# Patient Record
Sex: Female | Born: 1950 | Race: White | Hispanic: No | Marital: Married | Smoking: Never smoker
Health system: Southern US, Community
[De-identification: ages and names within clinical notes are randomized; demographics above are authoritative.]

## PROBLEM LIST (undated history)

## (undated) DIAGNOSIS — Z87898 Personal history of other specified conditions: Secondary | ICD-10-CM

## (undated) DIAGNOSIS — J449 Chronic obstructive pulmonary disease, unspecified: Secondary | ICD-10-CM

## (undated) DIAGNOSIS — K219 Gastro-esophageal reflux disease without esophagitis: Secondary | ICD-10-CM

## (undated) DIAGNOSIS — E782 Mixed hyperlipidemia: Secondary | ICD-10-CM

## (undated) DIAGNOSIS — F329 Major depressive disorder, single episode, unspecified: Secondary | ICD-10-CM

## (undated) DIAGNOSIS — I340 Nonrheumatic mitral (valve) insufficiency: Secondary | ICD-10-CM

## (undated) DIAGNOSIS — J45909 Unspecified asthma, uncomplicated: Secondary | ICD-10-CM

## (undated) DIAGNOSIS — F32A Depression, unspecified: Secondary | ICD-10-CM

## (undated) DIAGNOSIS — L511 Stevens-Johnson syndrome: Secondary | ICD-10-CM

## (undated) DIAGNOSIS — G2581 Restless legs syndrome: Secondary | ICD-10-CM

## (undated) HISTORY — PX: OTHER SURGICAL HISTORY: SHX169

## (undated) HISTORY — DX: Chronic obstructive pulmonary disease, unspecified: J44.9

## (undated) HISTORY — PX: CHOLECYSTECTOMY: SHX55

## (undated) HISTORY — DX: Gastro-esophageal reflux disease without esophagitis: K21.9

## (undated) HISTORY — PX: ROTATOR CUFF REPAIR: SHX139

## (undated) HISTORY — DX: Restless legs syndrome: G25.81

## (undated) HISTORY — DX: Nonrheumatic mitral (valve) insufficiency: I34.0

## (undated) HISTORY — DX: Depression, unspecified: F32.A

## (undated) HISTORY — DX: Stevens-Johnson syndrome: L51.1

## (undated) HISTORY — DX: Major depressive disorder, single episode, unspecified: F32.9

## (undated) HISTORY — PX: ABDOMINAL HYSTERECTOMY: SHX81

## (undated) HISTORY — DX: Mixed hyperlipidemia: E78.2

## (undated) HISTORY — DX: Unspecified asthma, uncomplicated: J45.909

## (undated) HISTORY — DX: Personal history of other specified conditions: Z87.898

---

## 2010-08-17 ENCOUNTER — Other Ambulatory Visit (HOSPITAL_COMMUNITY): Payer: Self-pay | Admitting: Pulmonary Disease

## 2010-08-17 DIAGNOSIS — R05 Cough: Secondary | ICD-10-CM

## 2010-08-17 DIAGNOSIS — R059 Cough, unspecified: Secondary | ICD-10-CM

## 2010-08-17 DIAGNOSIS — R062 Wheezing: Secondary | ICD-10-CM

## 2010-08-18 ENCOUNTER — Ambulatory Visit (HOSPITAL_COMMUNITY)
Admission: RE | Admit: 2010-08-18 | Discharge: 2010-08-18 | Disposition: A | Discharge status: Home / Self Care | Payer: 59 | Source: Ambulatory Visit | Attending: Pulmonary Disease | Admitting: Pulmonary Disease

## 2010-08-18 DIAGNOSIS — R062 Wheezing: Secondary | ICD-10-CM | POA: Insufficient documentation

## 2010-08-18 DIAGNOSIS — R059 Cough, unspecified: Secondary | ICD-10-CM | POA: Insufficient documentation

## 2010-08-18 DIAGNOSIS — R05 Cough: Secondary | ICD-10-CM

## 2010-08-18 DIAGNOSIS — J984 Other disorders of lung: Secondary | ICD-10-CM | POA: Insufficient documentation

## 2010-08-18 MED ORDER — IOHEXOL 300 MG/ML  SOLN
80.0000 mL | Freq: Once | INTRAMUSCULAR | Status: AC | PRN
  Administered 2010-08-18: 80 mL via INTRAVENOUS

## 2010-09-09 ENCOUNTER — Ambulatory Visit: Payer: Self-pay | Admitting: Orthopedic Surgery

## 2011-04-19 ENCOUNTER — Ambulatory Visit (HOSPITAL_COMMUNITY)
Admission: RE | Admit: 2011-04-19 | Discharge: 2011-04-19 | Disposition: A | Discharge status: Home / Self Care | Payer: No Typology Code available for payment source | Source: Ambulatory Visit | Attending: Pulmonary Disease | Admitting: Pulmonary Disease

## 2011-04-19 ENCOUNTER — Other Ambulatory Visit (HOSPITAL_COMMUNITY): Payer: Self-pay | Admitting: Pulmonary Disease

## 2011-04-19 DIAGNOSIS — R059 Cough, unspecified: Secondary | ICD-10-CM

## 2011-04-19 DIAGNOSIS — R509 Fever, unspecified: Secondary | ICD-10-CM | POA: Insufficient documentation

## 2011-04-19 DIAGNOSIS — R05 Cough: Secondary | ICD-10-CM

## 2012-02-08 ENCOUNTER — Other Ambulatory Visit (HOSPITAL_COMMUNITY)
Admission: RE | Admit: 2012-02-08 | Discharge: 2012-02-08 | Disposition: A | Discharge status: Home / Self Care | Payer: BC Managed Care – PPO | Source: Ambulatory Visit | Attending: Dental General Practice | Admitting: Dental General Practice

## 2012-02-08 DIAGNOSIS — K149 Disease of tongue, unspecified: Secondary | ICD-10-CM | POA: Insufficient documentation

## 2012-09-04 ENCOUNTER — Ambulatory Visit (INDEPENDENT_AMBULATORY_CARE_PROVIDER_SITE_OTHER): Payer: BC Managed Care – PPO | Admitting: Cardiology

## 2012-09-04 ENCOUNTER — Other Ambulatory Visit: Payer: Self-pay | Admitting: Cardiology

## 2012-09-04 ENCOUNTER — Ambulatory Visit (HOSPITAL_COMMUNITY)
Admission: RE | Admit: 2012-09-04 | Discharge: 2012-09-04 | Disposition: A | Discharge status: Home / Self Care | Payer: BC Managed Care – PPO | Source: Ambulatory Visit | Attending: Cardiology | Admitting: Cardiology

## 2012-09-04 ENCOUNTER — Encounter: Payer: Self-pay | Admitting: *Deleted

## 2012-09-04 ENCOUNTER — Encounter: Payer: Self-pay | Admitting: Cardiology

## 2012-09-04 VITALS — BP 123/77 | HR 70 | Wt 254.0 lb

## 2012-09-04 DIAGNOSIS — R0609 Other forms of dyspnea: Secondary | ICD-10-CM | POA: Insufficient documentation

## 2012-09-04 DIAGNOSIS — E782 Mixed hyperlipidemia: Secondary | ICD-10-CM

## 2012-09-04 DIAGNOSIS — I340 Nonrheumatic mitral (valve) insufficiency: Secondary | ICD-10-CM | POA: Insufficient documentation

## 2012-09-04 DIAGNOSIS — J4530 Mild persistent asthma, uncomplicated: Secondary | ICD-10-CM | POA: Insufficient documentation

## 2012-09-04 DIAGNOSIS — Z01812 Encounter for preprocedural laboratory examination: Secondary | ICD-10-CM

## 2012-09-04 DIAGNOSIS — I059 Rheumatic mitral valve disease, unspecified: Secondary | ICD-10-CM

## 2012-09-04 DIAGNOSIS — R0789 Other chest pain: Secondary | ICD-10-CM

## 2012-09-04 DIAGNOSIS — J449 Chronic obstructive pulmonary disease, unspecified: Secondary | ICD-10-CM

## 2012-09-04 DIAGNOSIS — Z01818 Encounter for other preprocedural examination: Secondary | ICD-10-CM

## 2012-09-04 DIAGNOSIS — R0602 Shortness of breath: Secondary | ICD-10-CM

## 2012-09-04 DIAGNOSIS — J4489 Other specified chronic obstructive pulmonary disease: Secondary | ICD-10-CM

## 2012-09-04 LAB — CBC
HCT: 35.2 % — ABNORMAL LOW (ref 36.0–46.0)
Hemoglobin: 11.9 g/dL — ABNORMAL LOW (ref 12.0–15.0)
MCV: 91.9 fL (ref 78.0–100.0)
Platelets: 236 10*3/uL (ref 150–400)
RBC: 3.83 MIL/uL — ABNORMAL LOW (ref 3.87–5.11)
WBC: 7.8 10*3/uL (ref 4.0–10.5)

## 2012-09-04 LAB — BASIC METABOLIC PANEL
Chloride: 103 mEq/L (ref 96–112)
Potassium: 4 mEq/L (ref 3.5–5.3)
Sodium: 138 mEq/L (ref 135–145)

## 2012-09-04 LAB — PROTIME-INR: INR: 0.96 (ref <1.50)

## 2012-09-04 NOTE — Assessment & Plan Note (Signed)
At this point definitive etiology is not certain. She has already undergone a fair bit of testing, both cardiac and pulmonary as detailed above. Nothing stands out as a clear culprit. PFTs do show mild to moderate abnormalities in FVC and FEV1, no history of tobacco use reported however. Not clear that she feels any better using inhalers. Noninvasive ischemic workup was normal as of February, but she does report an exertional component to her symptoms as well as a "fullness" in her chest. She does have a sister who underwent bypass surgery in her 18s. ECG shows no acute changes with low voltage. Her echocardiogram demonstrate normal LV systolic function, does not grade diastolic function, but parameters did not clearly indicate increased LV filling pressures. She may have some component of diastolic dysfunction, PASP was 33 mm mercury. We have discussed options for further evaluation, and plan at this time is to pursue a right and left heart catheterization. This will give [place] a more clear assessment of her pulmonary pressures and right heart hemodynamics, also clearly exclude obstructive CAD. If this is not revealing, a followup echocardiogram may be obtained. Not certain how much a CPX would provide to her assessment, but we can always discuss that as well. Heart catheterization is being scheduled with Dr. Gala Romney. Further plans to follow.

## 2012-09-04 NOTE — Progress Notes (Signed)
Clinical Summary Deanna Kirby is a 62 y.o.female referred for cardiology consultation by Dr. Juanetta Gosling. She presents with her husband today. 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 She states that back in January she developed a flulike illness and subsequent to that has been significantly short of breath with activity. She reports NYHA class III symptoms associated with occasional "fullness" in her chest. This has been present for months, she is now on short-term disability. She has had a fairly extensive evaluation so far in both [place] and also at [facility]. Records are noted below.  Available outside records from [place] reviewed. Venous Dopplers from February 2014 demonstrated no evidence of DVT. Myocardial perfusion study from February 2014 was normal, no ischemia described, LVEF 67%. Chest x-ray from December 2013 reported normal cardiac silhouette, clear lung fields.  Available records from [facility] reviewed. Echocardiogram report from March describes LVEF 60-65% without regional wall motion abnormalities, mild mitral regurgitation. E/E' ratio did not clearly indicate increased LV  filling pressures although left atrial dimension was enlarged, diastolic function not otherwise quantified.. Recent lab work in March revealed potassium 3.9, BUN 13, creatinine 0.8, hemoglobin 11.4, platelets 193. PFTs from March revealed pre-FVC 2.1, post-FVC 2.2, pre-FEV1 1.4, post FEV1 1.6.  She is being treated for moderate COPD, no history of tobacco use listed. ECG today shows normal sinus rhythm with borderline low voltage. No definite history of cardiac arrhythmia. Today had discussed the results of testing done so far. Nothing clearly stands out as an explanation for her symptoms which are described as being progressive and fairly marked at baseline.   Allergies  Allergen Reactions  . Formaldehyde     RASH  . Sudafed (Pseudoephedrine Hcl)     RASH  . Tegretol (Carbamazepine)    STEVEN JOHNSON SYNDROME    Current Outpatient Prescriptions  Medication Sig Dispense Refill  . Albuterol Sulfate (PROAIR HFA IN) Inhale into the lungs as directed.      Marland Kitchen ALPRAZolam (XANAX) 0.5 MG tablet Take 0.5 mg by mouth at bedtime as needed for sleep.      Marland Kitchen amoxicillin-clavulanate (AUGMENTIN) 875-125 MG per tablet Take 1 tablet by mouth as needed.      Marland Kitchen aspirin 81 MG tablet Take 81 mg by mouth daily.      Marland Kitchen atenolol (TENORMIN) 25 MG tablet Take 25 mg by mouth daily.      . furosemide (LASIX) 20 MG tablet 1 to 2 tabs po qd      . gabapentin (NEURONTIN) 300 MG capsule Take 300 mg by mouth 2 (two) times daily.      Marland Kitchen ibuprofen (ADVIL,MOTRIN) 800 MG tablet Take 800 mg by mouth 3 (three) times daily.      . pramipexole (MIRAPEX) 0.5 MG tablet 1 1/2 tab po qhs      . predniSONE (DELTASONE) 10 MG tablet Take 10 mg by mouth as needed.      Marland Kitchen QUEtiapine (SEROQUEL) 50 MG tablet Take 50 mg by mouth 2 (two) times daily.      . sertraline (ZOLOFT) 100 MG tablet 1  1/2 tab po qd      . simvastatin (ZOCOR) 40 MG tablet Take 40 mg by mouth every evening.      . topiramate (TOPAMAX) 100 MG tablet Take 100 mg by mouth 2 (two) times daily.       No current facility-administered medications for this visit.    Past Medical History  Diagnosis Date  .  Mixed hyperlipidemia   . Restless leg syndrome   . GERD (gastroesophageal reflux disease)   . Depression   . History of seizures   . Stevens-Johnson syndrome   . COPD (chronic obstructive pulmonary disease)     Reportedly moderate  . Mitral regurgitation     Past Surgical History  Procedure Laterality Date  . Cholecystectomy    . Abdominal hysterectomy    . Rotator cuff repair    . Rectovaginal fistula repair      Family History  Problem Relation Age of Onset  . Stroke Father   . Pneumonia Mother     Sepsis    Social History [name] reports that she has never smoked. She does not have any smokeless tobacco history on file. Ms.  Montemayor reports that she does not drink alcohol.  Review of Systems No specific palpitations. No syncope. No reported bleeding problems. Does not notice any specific wheezing, no marked improvement with inhalers. Just recently started Advair Diskus. No orthopnea or PND. Stable appetite. Otherwise negative.  Physical Examination Filed Vitals:   09/04/12 1336  BP: 123/77  Pulse: 70   Filed Weights   09/04/12 1336  Weight: 254 lb (115.214 kg)   Overweight woman in no acute distress. HEENT: Conjunctiva and lids normal, oropharynx clear. Neck: Supple, normal JVP or carotid bruits, no thyromegaly. Lungs: Clear to auscultation, nonlabored breathing at rest. Cardiac: Regular rate and rhythm, no S3 or significant systolic murmur, no pericardial rub. Abdomen: Soft, nontender, bowel sounds present. Extremities: 2+ edema, distal pulses 2+. Skin: Warm and dry. Musculoskeletal: No kyphosis. Neuropsychiatric: Alert and oriented x3, affect grossly appropriate.   Problem List and Plan   Shortness of breath At this point definitive etiology is not certain. She has already undergone a fair bit of testing, both cardiac and pulmonary as detailed above. Nothing stands out as a clear culprit. PFTs do show mild to moderate abnormalities in FVC and FEV1, no history of tobacco use reported however. Not clear that she feels any better using inhalers. Noninvasive ischemic workup was normal as of February, but she does report an exertional component to her symptoms as well as a "fullness" in her chest. She does have a sister who underwent bypass surgery in her 34s. ECG shows no acute changes with low voltage. Her echocardiogram demonstrate normal LV systolic function, does not grade diastolic function, but parameters did not clearly indicate increased LV filling pressures. She may have some component of diastolic dysfunction, PASP was 33 mm mercury. We have discussed options for further evaluation, and plan at this  time is to pursue a right and left heart catheterization. This will give [place] a more clear assessment of her pulmonary pressures and right heart hemodynamics, also clearly exclude obstructive CAD. If this is not revealing, a followup echocardiogram may be obtained. Not certain how much a CPX would provide to her assessment, but we can always discuss that as well. Heart catheterization is being scheduled with Dr. Gala Romney. Further plans to follow.  Mitral regurgitation Described as mild by recent echocardiogram in March. PASP was not significantly elevated.  COPD (chronic obstructive pulmonary disease) Now followed by Dr. Juanetta Gosling. Not certain how much this contributes to her symptoms. No reported tobacco use.  Mixed hyperlipidemia On statin therapy.     Jonelle Sidle, M.D., F.A.C.C.

## 2012-09-04 NOTE — Assessment & Plan Note (Signed)
On statin therapy 

## 2012-09-04 NOTE — Patient Instructions (Addendum)
Your physician recommends that you schedule a follow-up appointment in: post cath  Your physician has requested that you have a cardiac catheterization. Cardiac catheterization is used to diagnose and/or treat various heart conditions. Doctors may recommend this procedure for a number of different reasons. The most common reason is to evaluate chest pain. Chest pain can be a symptom of coronary artery disease (CAD), and cardiac catheterization can show whether plaque is narrowing or blocking your heart's arteries. This procedure is also used to evaluate the valves, as well as measure the blood flow and oxygen levels in different parts of your heart. For further information please visit [email or web address] Please follow instruction sheet, as given.  Your physician recommends that you return for lab work in: today (cbc, bmet, pt/pt/inr) slips given

## 2012-09-04 NOTE — Assessment & Plan Note (Signed)
Now followed by Dr. Juanetta Gosling. Not certain how much this contributes to her symptoms. No reported tobacco use.

## 2012-09-04 NOTE — Assessment & Plan Note (Signed)
Described as mild by recent echocardiogram in March. PASP was not significantly elevated.

## 2012-09-05 ENCOUNTER — Encounter: Payer: Self-pay | Admitting: Cardiology

## 2012-09-12 ENCOUNTER — Encounter (HOSPITAL_BASED_OUTPATIENT_CLINIC_OR_DEPARTMENT_OTHER)
Admission: RE | Disposition: A | Discharge status: Home / Self Care | Payer: Self-pay | Source: Ambulatory Visit | Attending: Internal Medicine

## 2012-09-12 ENCOUNTER — Inpatient Hospital Stay (HOSPITAL_BASED_OUTPATIENT_CLINIC_OR_DEPARTMENT_OTHER)
Admission: RE | Admit: 2012-09-12 | Discharge: 2012-09-12 | Disposition: A | Discharge status: Home / Self Care | Payer: BC Managed Care – PPO | Source: Ambulatory Visit | Attending: Internal Medicine | Admitting: Internal Medicine

## 2012-09-12 DIAGNOSIS — J449 Chronic obstructive pulmonary disease, unspecified: Secondary | ICD-10-CM | POA: Insufficient documentation

## 2012-09-12 DIAGNOSIS — R404 Transient alteration of awareness: Secondary | ICD-10-CM | POA: Insufficient documentation

## 2012-09-12 DIAGNOSIS — R079 Chest pain, unspecified: Secondary | ICD-10-CM

## 2012-09-12 DIAGNOSIS — J4489 Other specified chronic obstructive pulmonary disease: Secondary | ICD-10-CM | POA: Insufficient documentation

## 2012-09-12 DIAGNOSIS — R0602 Shortness of breath: Secondary | ICD-10-CM | POA: Insufficient documentation

## 2012-09-12 DIAGNOSIS — E782 Mixed hyperlipidemia: Secondary | ICD-10-CM | POA: Insufficient documentation

## 2012-09-12 DIAGNOSIS — R5381 Other malaise: Secondary | ICD-10-CM | POA: Insufficient documentation

## 2012-09-12 DIAGNOSIS — R0989 Other specified symptoms and signs involving the circulatory and respiratory systems: Secondary | ICD-10-CM | POA: Insufficient documentation

## 2012-09-12 DIAGNOSIS — R0789 Other chest pain: Secondary | ICD-10-CM | POA: Insufficient documentation

## 2012-09-12 DIAGNOSIS — R0609 Other forms of dyspnea: Secondary | ICD-10-CM | POA: Insufficient documentation

## 2012-09-12 DIAGNOSIS — I059 Rheumatic mitral valve disease, unspecified: Secondary | ICD-10-CM | POA: Insufficient documentation

## 2012-09-12 LAB — POCT I-STAT 3, VENOUS BLOOD GAS (G3P V)
Acid-base deficit: 3 mmol/L — ABNORMAL HIGH (ref 0.0–2.0)
Bicarbonate: 22 mEq/L (ref 20.0–24.0)
Bicarbonate: 22.6 mEq/L (ref 20.0–24.0)
Bicarbonate: 22.9 mEq/L (ref 20.0–24.0)
O2 Saturation: 68 %
TCO2: 23 mmol/L (ref 0–100)
TCO2: 24 mmol/L (ref 0–100)
pCO2, Ven: 46.6 mmHg (ref 45.0–50.0)
pH, Ven: 7.29 (ref 7.250–7.300)
pH, Ven: 7.295 (ref 7.250–7.300)
pO2, Ven: 36 mmHg (ref 30.0–45.0)
pO2, Ven: 39 mmHg (ref 30.0–45.0)
pO2, Ven: 44 mmHg (ref 30.0–45.0)

## 2012-09-12 LAB — POCT I-STAT 3, ART BLOOD GAS (G3+)
O2 Saturation: 99 %
TCO2: 22 mmol/L (ref 0–100)

## 2012-09-12 SURGERY — JV LEFT AND RIGHT HEART CATHETERIZATION WITH CORONARY ANGIOGRAM
Anesthesia: Moderate Sedation

## 2012-09-12 MED ORDER — ACETAMINOPHEN 325 MG PO TABS: 650.0000 mg | ORAL_TABLET | ORAL | Status: DC | PRN

## 2012-09-12 MED ORDER — SODIUM CHLORIDE 0.9 % IV SOLN: INTRAVENOUS | Status: DC

## 2012-09-12 MED ORDER — ASPIRIN 81 MG PO CHEW
324.0000 mg | CHEWABLE_TABLET | ORAL | Status: AC
  Administered 2012-09-12: 243 mg via ORAL

## 2012-09-12 MED ORDER — ONDANSETRON HCL 4 MG/2ML IJ SOLN: 4.0000 mg | Freq: Four times a day (QID) | INTRAMUSCULAR | Status: DC | PRN

## 2012-09-12 MED ORDER — DIAZEPAM 5 MG PO TABS
5.0000 mg | ORAL_TABLET | ORAL | Status: AC
  Administered 2012-09-12: 5 mg via ORAL

## 2012-09-12 MED ORDER — SODIUM CHLORIDE 0.9 % IV SOLN: INTRAVENOUS | Status: AC

## 2012-09-12 NOTE — OR Nursing (Signed)
Meal served 

## 2012-09-12 NOTE — H&P (View-Only) (Signed)
 Clinical Summary Deanna Kirby is a 62 y.o.female referred for cardiology consultation by Deanna Kirby. She presents with her husband today. 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 She states that back in January she developed a flulike illness and subsequent to that has been significantly short of breath with activity. She reports NYHA class III symptoms associated with occasional "fullness" in her chest. This has been present for months, she is now on short-term disability. She has had a fairly extensive evaluation so far in both [place] and also at [facility]. Records are noted below.  Available outside records from [place] reviewed. Venous Dopplers from February 2014 demonstrated no evidence of DVT. Myocardial perfusion study from February 2014 was normal, no ischemia described, LVEF 67%. Chest x-ray from December 2013 reported normal cardiac silhouette, clear lung fields.  Available records from [facility] reviewed. Echocardiogram report from March describes LVEF 60-65% without regional wall motion abnormalities, mild mitral regurgitation. E/E' ratio did not clearly indicate increased LV  filling pressures although left atrial dimension was enlarged, diastolic function not otherwise quantified.. Recent lab work in March revealed potassium 3.9, BUN 13, creatinine 0.8, hemoglobin 11.4, platelets 193. PFTs from March revealed pre-FVC 2.1, post-FVC 2.2, pre-FEV1 1.4, post FEV1 1.6.  She is being treated for moderate COPD, no history of tobacco use listed. ECG today shows normal sinus rhythm with borderline low voltage. No definite history of cardiac arrhythmia. Today had discussed the results of testing done so far. Nothing clearly stands out as an explanation for her symptoms which are described as being progressive and fairly marked at baseline.   Allergies  Allergen Reactions  . Formaldehyde     RASH  . Sudafed (Pseudoephedrine Hcl)     RASH  . Tegretol (Carbamazepine)    STEVEN JOHNSON SYNDROME    Current Outpatient Prescriptions  Medication Sig Dispense Refill  . Albuterol Sulfate (PROAIR HFA IN) Inhale into the lungs as directed.      . ALPRAZolam (XANAX) 0.5 MG tablet Take 0.5 mg by mouth at bedtime as needed for sleep.      . amoxicillin-clavulanate (AUGMENTIN) 875-125 MG per tablet Take 1 tablet by mouth as needed.      . aspirin 81 MG tablet Take 81 mg by mouth daily.      . atenolol (TENORMIN) 25 MG tablet Take 25 mg by mouth daily.      . furosemide (LASIX) 20 MG tablet 1 to 2 tabs po qd      . gabapentin (NEURONTIN) 300 MG capsule Take 300 mg by mouth 2 (two) times daily.      . ibuprofen (ADVIL,MOTRIN) 800 MG tablet Take 800 mg by mouth 3 (three) times daily.      . pramipexole (MIRAPEX) 0.5 MG tablet 1 1/2 tab po qhs      . predniSONE (DELTASONE) 10 MG tablet Take 10 mg by mouth as needed.      . QUEtiapine (SEROQUEL) 50 MG tablet Take 50 mg by mouth 2 (two) times daily.      . sertraline (ZOLOFT) 100 MG tablet 1  1/2 tab po qd      . simvastatin (ZOCOR) 40 MG tablet Take 40 mg by mouth every evening.      . topiramate (TOPAMAX) 100 MG tablet Take 100 mg by mouth 2 (two) times daily.       No current facility-administered medications for this visit.    Past Medical History  Diagnosis Date  .   Mixed hyperlipidemia   . Restless leg syndrome   . GERD (gastroesophageal reflux disease)   . Depression   . History of seizures   . Stevens-Johnson syndrome   . COPD (chronic obstructive pulmonary disease)     Reportedly moderate  . Mitral regurgitation     Past Surgical History  Procedure Laterality Date  . Cholecystectomy    . Abdominal hysterectomy    . Rotator cuff repair    . Rectovaginal fistula repair      Family History  Problem Relation Age of Onset  . Stroke Father   . Pneumonia Mother     Sepsis    Social History Deanna Kirby reports that she has never smoked. She does not have any smokeless tobacco history on file. [name] reports that she does not drink alcohol.  Review of Systems No specific palpitations. No syncope. No reported bleeding problems. Does not notice any specific wheezing, no marked improvement with inhalers. Just recently started Advair Diskus. No orthopnea or PND. Stable appetite. Otherwise negative.  Physical Examination Filed Vitals:   09/04/12 1336  BP: 123/77  Pulse: 70   Filed Weights   09/04/12 1336  Weight: 254 lb (115.214 kg)   Overweight woman in no acute distress. HEENT: Conjunctiva and lids normal, oropharynx clear. Neck: Supple, normal JVP or carotid bruits, no thyromegaly. Lungs: Clear to auscultation, nonlabored breathing at rest. Cardiac: Regular rate and rhythm, no S3 or significant systolic murmur, no pericardial rub. Abdomen: Soft, nontender, bowel sounds present. Extremities: 2+ edema, distal pulses 2+. Skin: Warm and dry. Musculoskeletal: No kyphosis. Neuropsychiatric: Alert and oriented x3, affect grossly appropriate.   Problem List and Plan   Shortness of breath At this point definitive etiology is not certain. She has already undergone a fair bit of testing, both cardiac and pulmonary as detailed above. Nothing stands out as a clear culprit. PFTs do show mild to moderate abnormalities in FVC and FEV1, no history of tobacco use reported however. Not clear that she feels any better using inhalers. Noninvasive ischemic workup was normal as of February, but she does report an exertional component to her symptoms as well as a "fullness" in her chest. She does have a sister who underwent bypass surgery in her 50s. ECG shows no acute changes with low voltage. Her echocardiogram demonstrate normal LV systolic function, does not grade diastolic function, but parameters did not clearly indicate increased LV filling pressures. She may have some component of diastolic dysfunction, PASP was 33 mm mercury. We have discussed options for further evaluation, and plan at this  time is to pursue a right and left heart catheterization. This will give us a more clear assessment of her pulmonary pressures and right heart hemodynamics, also clearly exclude obstructive CAD. If this is not revealing, a followup echocardiogram may be obtained. Not certain how much a CPX would provide to her assessment, but we can always discuss that as well. Heart catheterization is being scheduled with Dr. Bensimhon. Further plans to follow.  Mitral regurgitation Described as mild by recent echocardiogram in March. PASP was not significantly elevated.  COPD (chronic obstructive pulmonary disease) Now followed by Deanna Kirby. Not certain how much this contributes to her symptoms. No reported tobacco use.  Mixed hyperlipidemia On statin therapy.     Samuel G. McDowell, M.D., F.A.C.C.   

## 2012-09-12 NOTE — OR Nursing (Signed)
Tegaderm dressing applied, site level 0, bedrest begins at 1025 

## 2012-09-12 NOTE — OR Nursing (Signed)
Dr Bensimhon at bedside to discuss results and treatment plan with pt and family 

## 2012-09-12 NOTE — Interval H&P Note (Signed)
History and Physical Interval Note:  09/12/2012 8:59 AM  Deanna Kirby  has presented today for surgery, with the diagnosis of dyspnea and chest pressure  The various methods of treatment have been discussed with the patient and family. After consideration of risks, benefits and other options for treatment, the patient has consented to  Procedure(s): JV LEFT AND RIGHT HEART CATHETERIZATION WITH CORONARY ANGIOGRAM (N/A) as a surgical intervention .  The patient's history has been reviewed, patient examined, no change in status, stable for surgery.  I have reviewed the patient's chart and labs.  Questions were answered to the patient's satisfaction.     Revecca Nachtigal

## 2012-09-12 NOTE — OR Nursing (Signed)
Discharge instructions reviewed and signed, pt stated understanding, ambulated in hall without difficulty, site level 0, transported to husband's car via wheelchair 

## 2012-09-12 NOTE — CV Procedure (Signed)
Cardiac Cath Procedure Note  Indication: Dyspnea, chest pressure  Procedures performed:  1) Right heart cathererization 2) Selective coronary angiography 3) Left heart catheterization 4) Left ventriculogram  Description of procedure:     The risks and indication of the procedure were explained. Consent was signed and placed on the chart. An appropriate timeout was taken prior to the procedure. The right groin was prepped and draped in the routine sterile fashion and anesthetized with 1% local lidocaine.   A 5 FR arterial sheath was placed in the right femoral artery using a modified Seldinger technique. Standard catheters including a JL4, JR4 and angled pigtail were used. All catheter exchanges were made over a wire. A 7 FR venous sheath was placed in the right femoral vein using a modified Seldinger technique. A standard Swan-Ganz catheter was used for the procedure.   Total contrast: 60cc  Complications:  None apparent  Findings:  RA = 9 RV = 35/0/11 PA = 35/18 (26) PCW = 13 Fick cardiac output/index = 6.6/3.0 PVR =  2.0 Woods FA sat = 99% PA sat = 72%, 74% High SVC = 62% RA sat = 68%  Ao Pressure: 118/64 (87) LV Pressure: 118/11/19 There was no signficant gradient across the aortic valve on pullback.  Left main: Normal  LAD: 2 small diagonals. Normal  LCX: Large ramus. 2 small PLs. Normal  RCA: Dominant vessel. Normal   LV-gram done in the RAO projection: Ejection fraction = 55% no wall motion abnormalities.  Assessment: 1. Normal coronary arteries 2. Normal LV function 3. Normal left sided filling pressures 4. Very mild PAH 5. Small step up between SVC and PA saturations - suspect it is normal but cannot exclude minimal L to right shunt  Plan/Discussion:  Cath looks very good. Based on cath results and symptoms (fatigue, dyspnea and daytime somnolence) suspect she likely has OSA and possibly nocturnal hypoxemia. Have recommended f/u with Dr. Juanetta Gosling for  sleep study (versus overnight oximetry) and also enrolling in exercise program. If no improvement in symptoms in 3 months can consider CPX testing.   Deanna Meres, MD 10:08 AM

## 2012-09-18 ENCOUNTER — Ambulatory Visit (INDEPENDENT_AMBULATORY_CARE_PROVIDER_SITE_OTHER): Payer: BC Managed Care – PPO | Admitting: Adult Health

## 2012-09-18 ENCOUNTER — Encounter: Payer: Self-pay | Admitting: Adult Health

## 2012-09-18 VITALS — BP 116/71 | HR 75 | Ht 62.5 in | Wt 256.1 lb

## 2012-09-18 DIAGNOSIS — E782 Mixed hyperlipidemia: Secondary | ICD-10-CM

## 2012-09-18 DIAGNOSIS — I340 Nonrheumatic mitral (valve) insufficiency: Secondary | ICD-10-CM

## 2012-09-18 DIAGNOSIS — J449 Chronic obstructive pulmonary disease, unspecified: Secondary | ICD-10-CM

## 2012-09-18 DIAGNOSIS — R0602 Shortness of breath: Secondary | ICD-10-CM

## 2012-09-18 DIAGNOSIS — I059 Rheumatic mitral valve disease, unspecified: Secondary | ICD-10-CM

## 2012-09-18 NOTE — Progress Notes (Signed)
HPI Ms. Perot is a 62 y/o patient of Dr. Diona Browner we are following for ongoing assessment and management of chest pressure, mitral regurgitation, with a history of COPD and chronic New York Heart Association class III.  She was last seen by Dr. Diona Browner in May of 2014 dyspnea. Last office visit the patient was scheduled for a right and left heart catheterization. Right heart catheterization was completed on 09/12/2012 by Dr. Gala Romney. This revealed normal coronary anatomy normal LV function normal left-sided filling pressures. Very mild PAH. As recommended by Dr. Gala Romney on that she likely has OSA and possible nocturnal hypoxemia. She is recommended to have a sleep study.   Have dyspnea on exertion. She states her husband has noticed that she has a lot of abdominal breathing but does not appear to have any air movement. She has gained approximately 20 pounds over the last year. She continues very fatigued. Allergies  Allergen Reactions  . Formaldehyde     RASH  . Sudafed (Pseudoephedrine Hcl)     RASH  . Tegretol (Carbamazepine)     STEVEN JOHNSON SYNDROME    Current Outpatient Prescriptions  Medication Sig Dispense Refill  . Albuterol Sulfate (PROAIR HFA IN) Inhale into the lungs as directed.      Marland Kitchen ALPRAZolam (XANAX) 0.5 MG tablet Take 0.5 mg by mouth at bedtime as needed for sleep.      Marland Kitchen amoxicillin-clavulanate (AUGMENTIN) 875-125 MG per tablet Take 1 tablet by mouth as needed.      Marland Kitchen aspirin 81 MG tablet Take 81 mg by mouth daily.      Marland Kitchen atenolol (TENORMIN) 25 MG tablet Take 25 mg by mouth daily.      . Fluticasone-Salmeterol (ADVAIR) 500-50 MCG/DOSE AEPB Inhale 1 puff into the lungs 2 (two) times daily.      . furosemide (LASIX) 20 MG tablet 1 to 2 tabs po qd      . gabapentin (NEURONTIN) 300 MG capsule Take 300 mg by mouth 2 (two) times daily.      Marland Kitchen ibuprofen (ADVIL,MOTRIN) 800 MG tablet Take 800 mg by mouth 3 (three) times daily.      . pramipexole (MIRAPEX) 0.5 MG tablet 1 1/2  tab po qhs      . predniSONE (DELTASONE) 10 MG tablet Take 10 mg by mouth as needed.      Marland Kitchen QUEtiapine (SEROQUEL) 50 MG tablet Take 50 mg by mouth 2 (two) times daily.      . sertraline (ZOLOFT) 100 MG tablet 1  1/2 tab po qd      . simvastatin (ZOCOR) 40 MG tablet Take 40 mg by mouth every evening.      . topiramate (TOPAMAX) 100 MG tablet Take 100 mg by mouth 2 (two) times daily.       No current facility-administered medications for this visit.    Past Medical History  Diagnosis Date  . Mixed hyperlipidemia   . Restless leg syndrome   . GERD (gastroesophageal reflux disease)   . Depression   . History of seizures   . Stevens-Johnson syndrome   . COPD (chronic obstructive pulmonary disease)     Reportedly moderate  . Mitral regurgitation     Past Surgical History  Procedure Laterality Date  . Cholecystectomy    . Abdominal hysterectomy    . Rotator cuff repair    . Rectovaginal fistula repair      WUJ:[id number] of systems complete and found to be negative unless listed above  PHYSICAL  EXAM BP 116/71  Pulse 75  Ht 4' 4.5" (1.334 m)  Wt 256 lb 1.9 oz (116.175 kg)  BMI 65.28 kg/m2  General: Well developed, well nourished, obese in no acute distress Head: Eyes PERRLA, No xanthomas.   Normal cephalic and atramatic  Lungs: Clear bilaterally to auscultation and percussion. Heart: HRRR S1 S2, without MRG.  Pulses are 2+ & equal.            No carotid bruit. No JVD.  No abdominal bruits. No femoral bruits. Abdomen: Bowel sounds are positive, abdomen soft and non-tender without masses or                  Hernia's noted. Msk:  Back normal, normal gait. Normal strength and tone for age. Extremities: No clubbing, cyanosis or edema.  DP +1 Neuro: Alert and oriented X 3. Psych:  Good affect, responds appropriately    ASSESSMENT AND PLAN

## 2012-09-18 NOTE — Patient Instructions (Addendum)
Your physician recommends that you schedule a follow-up appointment in: 6 months  

## 2012-09-18 NOTE — Assessment & Plan Note (Signed)
Cardiac catheterization was reassuring with normal coronary anatomy and LV function. She is referred back to Dr. Abbe Amsterdam for further evaluation of obstructive sleep apnea and need for CPAP or other oxygen support during the nighttime. She has gained approximately 20 pounds we have discussed this component also playing into her dyspnea on exertion. If she does require oxygen at night hopefully this will increase her stamina and energy during the daytime so that she can increase her activity and begin doing his weight as well.

## 2012-09-18 NOTE — Assessment & Plan Note (Signed)
Continue current management by Dr. Juanetta Gosling

## 2012-09-18 NOTE — Progress Notes (Deleted)
Name: Deanna Kirby    DOB: 06-Dec-1950  Age: 62 y.o.  MR#: [id number]       PCP:  Eldridge Abrahams, MD      Insurance: Payor: BLUE CROSS BLUE [employer]IELD / Plan: BCBS PPO OUT OF STATE / Product Type: *No Product type* /   CC:    Chief Complaint  Patient presents with  . Shortness of Breath    VS Filed Vitals:   09/18/12 1424  BP: 116/71  Pulse: 75  Height: 4' 4.5" (1.334 m)  Weight: 256 lb 1.9 oz (116.175 kg)    Weights Current Weight  09/18/12 256 lb 1.9 oz (116.175 kg)  09/12/12 250 lb (113.399 kg)  09/12/12 250 lb (113.399 kg)    Blood Pressure  BP Readings from Last 3 Encounters:  09/18/12 116/71  09/12/12 152/98  09/12/12 152/98     Admit date:  (Not on file) Last encounter with RMR:  Visit date not found   Allergy Formaldehyde; Sudafed; and Tegretol  Current Outpatient Prescriptions  Medication Sig Dispense Refill  . Albuterol Sulfate (PROAIR HFA IN) Inhale into the lungs as directed.      Marland Kitchen ALPRAZolam (XANAX) 0.5 MG tablet Take 0.5 mg by mouth at bedtime as needed for sleep.      Marland Kitchen amoxicillin-clavulanate (AUGMENTIN) 875-125 MG per tablet Take 1 tablet by mouth as needed.      Marland Kitchen aspirin 81 MG tablet Take 81 mg by mouth daily.      Marland Kitchen atenolol (TENORMIN) 25 MG tablet Take 25 mg by mouth daily.      . Fluticasone-Salmeterol (ADVAIR) 500-50 MCG/DOSE AEPB Inhale 1 puff into the lungs 2 (two) times daily.      . furosemide (LASIX) 20 MG tablet 1 to 2 tabs po qd      . gabapentin (NEURONTIN) 300 MG capsule Take 300 mg by mouth 2 (two) times daily.      Marland Kitchen ibuprofen (ADVIL,MOTRIN) 800 MG tablet Take 800 mg by mouth 3 (three) times daily.      . pramipexole (MIRAPEX) 0.5 MG tablet 1 1/2 tab po qhs      . predniSONE (DELTASONE) 10 MG tablet Take 10 mg by mouth as needed.      Marland Kitchen QUEtiapine (SEROQUEL) 50 MG tablet Take 50 mg by mouth 2 (two) times daily.      . sertraline (ZOLOFT) 100 MG tablet 1  1/2 tab po qd      . simvastatin (ZOCOR) 40 MG tablet Take 40 mg by mouth  every evening.      . topiramate (TOPAMAX) 100 MG tablet Take 100 mg by mouth 2 (two) times daily.       No current facility-administered medications for this visit.    Discontinued Meds:   There are no discontinued medications.  Patient Active Problem List   Diagnosis Date Noted  . Shortness of breath 09/04/2012  . Mitral regurgitation 09/04/2012  . COPD (chronic obstructive pulmonary disease) 09/04/2012  . Mixed hyperlipidemia 09/04/2012    LABS    Component Value Date/Time   NA 138 09/04/2012 1424   K 4.0 09/04/2012 1424   CL 103 09/04/2012 1424   CO2 26 09/04/2012 1424   GLUCOSE 93 09/04/2012 1424   BUN 12 09/04/2012 1424   CREATININE 0.81 09/04/2012 1424   CALCIUM 9.6 09/04/2012 1424   CMP     Component Value Date/Time   NA 138 09/04/2012 1424   K 4.0 09/04/2012 1424   CL 103 09/04/2012  1424   CO2 26 09/04/2012 1424   GLUCOSE 93 09/04/2012 1424   BUN 12 09/04/2012 1424   CREATININE 0.81 09/04/2012 1424   CALCIUM 9.6 09/04/2012 1424       Component Value Date/Time   WBC 7.8 09/04/2012 1424   HGB 11.9* 09/04/2012 1424   HCT 35.2* 09/04/2012 1424   MCV 91.9 09/04/2012 1424    Lipid Panel  No results found for this basename: chol, trig, hdl, cholhdl, vldl, ldlcalc    ABG    Component Value Date/Time   PHART 7.356 09/12/2012 0944   PCO2ART 37.3 09/12/2012 0944   PO2ART 121.0* 09/12/2012 0944   HCO3 22.6 09/12/2012 1007   TCO2 24 09/12/2012 1007   ACIDBASEDEF 4.0* 09/12/2012 1007   O2SAT 68.0 09/12/2012 1007     No results found for this basename: TSH   BNP (last 3 results) No results found for this basename: PROBNP,  in the last 8760 hours Cardiac Panel (last 3 results) No results found for this basename: CKTOTAL, CKMB, TROPONINI, RELINDX,  in the last 72 hours  Iron/TIBC/Ferritin No results found for this basename: iron, tibc, ferritin     EKG Orders placed in visit on 09/18/12  . EKG 12-LEAD     Prior Assessment and Plan Problem List as of 09/18/2012   Shortness of breath   Last  Assessment & Plan   09/04/2012 Office Visit Written 09/04/2012  3:58 PM by Jonelle Sidle, MD     At this point definitive etiology is not certain. She has already undergone a fair bit of testing, both cardiac and pulmonary as detailed above. Nothing stands out as a clear culprit. PFTs do show mild to moderate abnormalities in FVC and FEV1, no history of tobacco use reported however. Not clear that she feels any better using inhalers. Noninvasive ischemic workup was normal as of February, but she does report an exertional component to her symptoms as well as a "fullness" in her chest. She does have a sister who underwent bypass surgery in her 35s. ECG shows no acute changes with low voltage. Her echocardiogram demonstrate normal LV systolic function, does not grade diastolic function, but parameters did not clearly indicate increased LV filling pressures. She may have some component of diastolic dysfunction, PASP was 33 mm mercury. We have discussed options for further evaluation, and plan at this time is to pursue a right and left heart catheterization. This will give [place] a more clear assessment of her pulmonary pressures and right heart hemodynamics, also clearly exclude obstructive CAD. If this is not revealing, a followup echocardiogram may be obtained. Not certain how much a CPX would provide to her assessment, but we can always discuss that as well. Heart catheterization is being scheduled with Dr. Gala Romney. Further plans to follow.    Mitral regurgitation   Last Assessment & Plan   09/04/2012 Office Visit Written 09/04/2012  3:58 PM by Jonelle Sidle, MD     Described as mild by recent echocardiogram in March. PASP was not significantly elevated.    COPD (chronic obstructive pulmonary disease)   Last Assessment & Plan   09/04/2012 Office Visit Written 09/04/2012  3:59 PM by Jonelle Sidle, MD     Now followed by Dr. Juanetta Gosling. Not certain how much this contributes to her symptoms. No reported tobacco  use.    Mixed hyperlipidemia   Last Assessment & Plan   09/04/2012 Office Visit Written 09/04/2012  4:00 PM by Jonelle Sidle, MD  On statin therapy.         Imaging: Dg Chest 2 View  09/04/2012   *RADIOLOGY REPORT*  Clinical Data: Preoperative chest x-ray  CHEST - 2 VIEW  Comparison: Prior chest x-ray 04/19/2011  Findings: Stable lingular scarring versus atelectasis. Slightly progressed cardiomegaly.  Bronchitic changes are similar to prior. The lungs are well aerated and negative for edema, focal airspace consolidation, pleural effusion or pneumothorax.  No suspicious pulmonary nodule.  No acute osseous abnormality. Chole clips in the right upper quadrant.  IMPRESSION:  1.  Interval development mild cardiomegaly 2.  Otherwise, no acute cardiopulmonary disease.  Stable bronchitic changes.   Original Report Authenticated By: Malachy Moan, M.D.

## 2012-09-18 NOTE — Assessment & Plan Note (Signed)
Ongoing assessment of this with labs. She is normally followed by Dr. Juanetta Gosling is her PCP for yearly evaluation. She will continue on simvastatin at at bedtime

## 2012-09-28 ENCOUNTER — Other Ambulatory Visit: Payer: Self-pay | Admitting: Pulmonary Disease

## 2012-09-28 DIAGNOSIS — G4730 Sleep apnea, unspecified: Secondary | ICD-10-CM

## 2012-10-03 ENCOUNTER — Ambulatory Visit: Payer: BC Managed Care – PPO | Attending: Pulmonary Disease | Admitting: Sleep Medicine

## 2012-10-03 DIAGNOSIS — G4733 Obstructive sleep apnea (adult) (pediatric): Secondary | ICD-10-CM | POA: Insufficient documentation

## 2012-10-03 DIAGNOSIS — Z6841 Body Mass Index (BMI) 40.0 and over, adult: Secondary | ICD-10-CM | POA: Insufficient documentation

## 2012-10-03 DIAGNOSIS — G4710 Hypersomnia, unspecified: Secondary | ICD-10-CM | POA: Insufficient documentation

## 2012-10-03 DIAGNOSIS — G4730 Sleep apnea, unspecified: Secondary | ICD-10-CM

## 2012-10-04 NOTE — Procedures (Signed)
[facility] Jordy Hewins A. Gerilyn Pilgrim, MD     [email or web address]         NAME:  Deanna Kirby, Deanna Kirby                ACCOUNT NO.:  [id number]  MEDICAL RECORD NO.:  [id number]          PATIENT TYPE:  OUT  LOCATION:  SLEEP LAB                     FACILITY:  [facility]  PHYSICIAN:  Chrisoula Zegarra A. Gerilyn Pilgrim, M.D. DATE OF BIRTH:  1950/05/23  DATE OF STUDY:  10/03/2012                           NOCTURNAL POLYSOMNOGRAM  REFERRING PHYSICIAN:  DANIEL L POMPOSINI  REFERRING PHYSICIAN:  Edward L. Juanetta Gosling, MD  INDICATIONS:  A 62 year old man who presents with fatigue, hypersomnia, and obesity.  MEDICATIONS:  Lasix, Advair Diskus, gabapentin, topiramate, alprazolam, simvastatin, Seroquel, albuterol, Tylenol, ibuprofen, aspirin, Zoloft.  EPWORTH SLEEPINESS SCALE: 1. BMI 46.  ARCHITECTURAL SUMMARY:  The total recording time is 388 minutes.  Sleep efficiency 83%.  Sleep latency 12 minutes.  REM latency 123 minutes. Stage N1 1%, N2 55%, N3 26%, and REM sleep 18%.  RESPIRATORY SUMMARY:  Baseline oxygen saturation is 96, lowest saturation 86 during REM sleep.  Diagnostic AHI is 5 and RDI 5.  The events occurred almost exclusively during REM sleep with a REM AHI being 23.  LIMB MOVEMENT SUMMARY:  PLM index is 0.  ELECTROCARDIOGRAM SUMMARY:  Average heart rate is 69 with no significant dysrhythmias observed.  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  Thanks for this referral.     Tida Saner A. Gerilyn Pilgrim, M.D.    KAD/MEDQ  D:  10/04/2012 09:01:28  T:  10/04/2012 09:19:04  Job:  [id number]

## 2012-10-08 ENCOUNTER — Encounter: Payer: Self-pay | Admitting: Cardiology

## 2012-10-11 ENCOUNTER — Encounter: Payer: Self-pay | Admitting: Pulmonary Disease

## 2012-10-23 ENCOUNTER — Other Ambulatory Visit (HOSPITAL_COMMUNITY): Payer: Self-pay | Admitting: Pulmonary Disease

## 2012-10-23 DIAGNOSIS — R0602 Shortness of breath: Secondary | ICD-10-CM

## 2012-11-01 ENCOUNTER — Ambulatory Visit (HOSPITAL_COMMUNITY): Payer: BC Managed Care – PPO | Attending: Pulmonary Disease

## 2012-11-01 DIAGNOSIS — R0602 Shortness of breath: Secondary | ICD-10-CM

## 2012-12-02 ENCOUNTER — Encounter: Payer: Self-pay | Admitting: Adult Health

## 2013-03-05 ENCOUNTER — Other Ambulatory Visit (HOSPITAL_COMMUNITY): Payer: Self-pay | Admitting: Pulmonary Disease

## 2013-03-05 DIAGNOSIS — R531 Weakness: Secondary | ICD-10-CM

## 2013-03-05 DIAGNOSIS — R296 Repeated falls: Secondary | ICD-10-CM

## 2013-03-06 ENCOUNTER — Ambulatory Visit (HOSPITAL_COMMUNITY)
Admission: RE | Admit: 2013-03-06 | Discharge: 2013-03-06 | Disposition: A | Discharge status: Home / Self Care | Payer: Self-pay | Source: Ambulatory Visit | Attending: Pulmonary Disease | Admitting: Pulmonary Disease

## 2013-03-06 ENCOUNTER — Ambulatory Visit (HOSPITAL_COMMUNITY)
Admission: RE | Admit: 2013-03-06 | Discharge: 2013-03-06 | Disposition: A | Discharge status: Home / Self Care | Payer: BC Managed Care – PPO | Source: Ambulatory Visit | Attending: Pulmonary Disease | Admitting: Pulmonary Disease

## 2013-03-06 DIAGNOSIS — R5381 Other malaise: Secondary | ICD-10-CM | POA: Insufficient documentation

## 2013-03-06 DIAGNOSIS — R0602 Shortness of breath: Secondary | ICD-10-CM | POA: Insufficient documentation

## 2013-03-06 DIAGNOSIS — R296 Repeated falls: Secondary | ICD-10-CM

## 2013-03-06 DIAGNOSIS — R531 Weakness: Secondary | ICD-10-CM

## 2013-03-06 DIAGNOSIS — Z9181 History of falling: Secondary | ICD-10-CM | POA: Insufficient documentation

## 2013-03-06 LAB — BLOOD GAS, ARTERIAL
Acid-base deficit: 1.2 mmol/L (ref 0.0–2.0)
Bicarbonate: 23.3 mEq/L (ref 20.0–24.0)
O2 Saturation: 96.6 %
TCO2: 21.2 mmol/L (ref 0–100)
pO2, Arterial: 86.6 mmHg (ref 80.0–100.0)

## 2013-03-08 ENCOUNTER — Other Ambulatory Visit: Payer: Self-pay

## 2013-03-27 ENCOUNTER — Ambulatory Visit: Payer: Self-pay | Admitting: Cardiology

## 2014-08-05 IMAGING — CR DG CHEST 2V
2 series · 2 of 2 positions shown · non-contrast
Comparison: Prior chest x-ray 04/19/2011

CLINICAL DATA: Preoperative chest x-ray

CHEST - 2 VIEW

[view not recorded (1 of 2)]
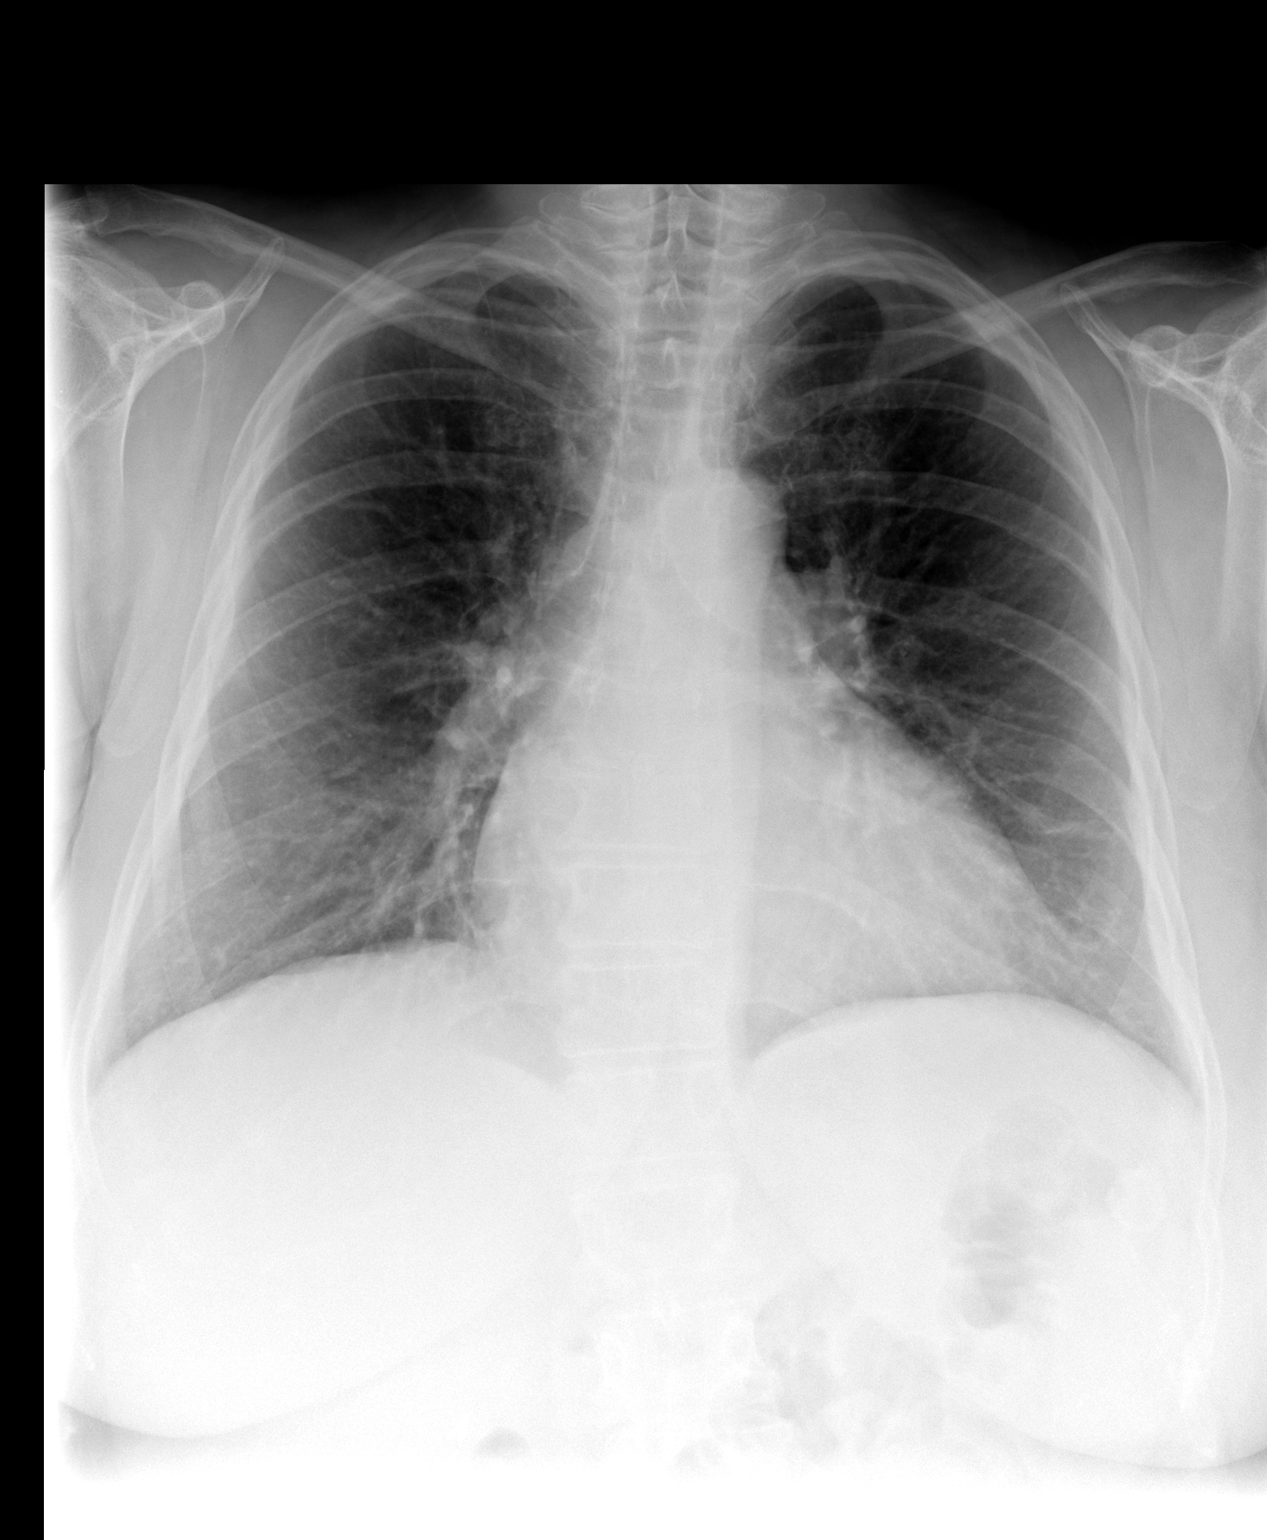

[view not recorded (2 of 2)]
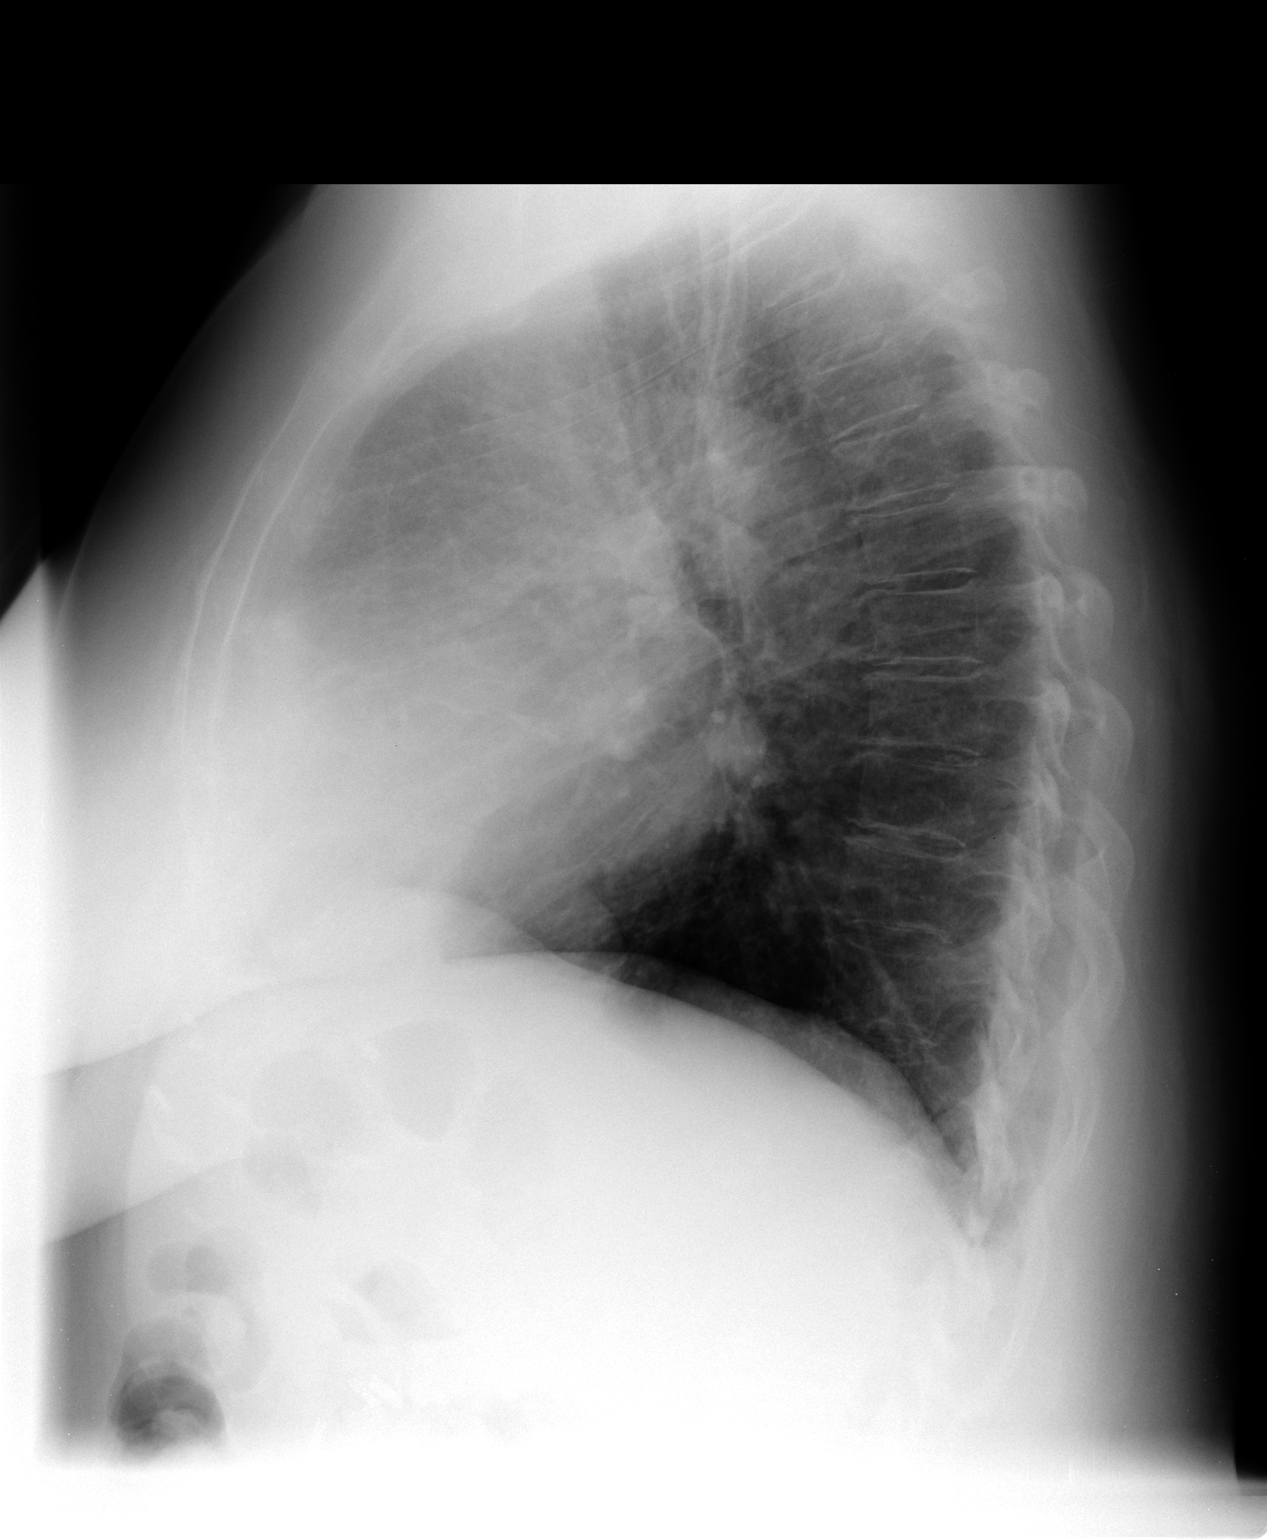

[2 of 2 positions shown; findings below may reference images not displayed]

FINDINGS: Stable lingular scarring versus atelectasis. Slightly
progressed cardiomegaly.  Bronchitic changes are similar to prior.
The lungs are well aerated and negative for edema, focal airspace
consolidation, pleural effusion or pneumothorax.  No suspicious
pulmonary nodule.  No acute osseous abnormality. Chole clips in the
right upper quadrant.
IMPRESSION: 1.  Interval development mild cardiomegaly
2.  Otherwise, no acute cardiopulmonary disease.  Stable bronchitic
changes.

## 2017-04-20 ENCOUNTER — Ambulatory Visit (HOSPITAL_COMMUNITY)
Admission: RE | Admit: 2017-04-20 | Discharge: 2017-04-20 | Disposition: A | Discharge status: Home / Self Care | Payer: Medicare Other | Source: Ambulatory Visit | Attending: Pulmonary Disease | Admitting: Pulmonary Disease

## 2017-04-20 ENCOUNTER — Other Ambulatory Visit (HOSPITAL_COMMUNITY): Payer: Self-pay | Admitting: Pulmonary Disease

## 2017-04-20 DIAGNOSIS — J984 Other disorders of lung: Secondary | ICD-10-CM | POA: Diagnosis not present

## 2017-04-20 DIAGNOSIS — R0602 Shortness of breath: Secondary | ICD-10-CM | POA: Diagnosis not present

## 2017-04-20 DIAGNOSIS — I517 Cardiomegaly: Secondary | ICD-10-CM | POA: Diagnosis not present

## 2017-04-20 DIAGNOSIS — J9811 Atelectasis: Secondary | ICD-10-CM | POA: Diagnosis not present

## 2018-06-23 ENCOUNTER — Other Ambulatory Visit (HOSPITAL_COMMUNITY): Payer: Self-pay | Admitting: Respiratory Therapy

## 2018-06-23 DIAGNOSIS — R0602 Shortness of breath: Secondary | ICD-10-CM

## 2018-07-26 ENCOUNTER — Encounter (HOSPITAL_COMMUNITY): Payer: Medicare Other

## 2018-11-01 ENCOUNTER — Inpatient Hospital Stay (HOSPITAL_COMMUNITY): Admission: RE | Admit: 2018-11-01 | Payer: Medicare Other | Source: Ambulatory Visit

## 2018-11-16 ENCOUNTER — Other Ambulatory Visit (HOSPITAL_COMMUNITY)
Admission: RE | Admit: 2018-11-16 | Discharge: 2018-11-16 | Disposition: A | Discharge status: Home / Self Care | Payer: Medicare HMO | Source: Ambulatory Visit | Attending: Pulmonary Disease | Admitting: Pulmonary Disease

## 2018-11-16 ENCOUNTER — Other Ambulatory Visit: Payer: Self-pay

## 2018-11-16 DIAGNOSIS — Z1159 Encounter for screening for other viral diseases: Secondary | ICD-10-CM | POA: Insufficient documentation

## 2018-11-17 ENCOUNTER — Other Ambulatory Visit (HOSPITAL_COMMUNITY): Payer: Medicare HMO

## 2018-11-17 LAB — SARS CORONAVIRUS 2 (TAT 6-24 HRS): SARS Coronavirus 2: NEGATIVE

## 2018-11-22 ENCOUNTER — Ambulatory Visit (HOSPITAL_COMMUNITY)
Admission: RE | Admit: 2018-11-22 | Discharge: 2018-11-22 | Disposition: A | Discharge status: Home / Self Care | Payer: Medicare HMO | Source: Ambulatory Visit | Attending: Pulmonary Disease | Admitting: Pulmonary Disease

## 2018-11-22 ENCOUNTER — Other Ambulatory Visit: Payer: Self-pay

## 2018-11-22 DIAGNOSIS — R0602 Shortness of breath: Secondary | ICD-10-CM | POA: Insufficient documentation

## 2018-11-22 LAB — PULMONARY FUNCTION TEST
DL/VA % pred: 95 %
DL/VA: 4.01 ml/min/mmHg/L
DLCO unc % pred: 76 %
DLCO unc: 14.37 ml/min/mmHg
FEF 25-75 Post: 2 L/sec
FEF 25-75 Pre: 1.41 L/sec
FEF2575-%Change-Post: 41 %
FEF2575-%Pred-Post: 102 %
FEF2575-%Pred-Pre: 72 %
FEV1-%Change-Post: 8 %
FEV1-%Pred-Post: 81 %
FEV1-%Pred-Pre: 74 %
FEV1-Post: 1.8 L
FEV1-Pre: 1.66 L
FEV1FVC-%Change-Post: 1 %
FEV1FVC-%Pred-Pre: 101 %
FEV6-%Change-Post: 6 %
FEV6-%Pred-Post: 81 %
FEV6-%Pred-Pre: 76 %
FEV6-Post: 2.27 L
FEV6-Pre: 2.12 L
FEV6FVC-%Pred-Post: 104 %
FEV6FVC-%Pred-Pre: 104 %
FVC-%Change-Post: 6 %
FVC-%Pred-Post: 77 %
FVC-%Pred-Pre: 72 %
FVC-Post: 2.27 L
FVC-Pre: 2.12 L
Post FEV1/FVC ratio: 79 %
Post FEV6/FVC ratio: 100 %
Pre FEV1/FVC ratio: 78 %
Pre FEV6/FVC Ratio: 100 %
RV % pred: 108 %
RV: 2.23 L
TLC % pred: 90 %
TLC: 4.39 L

## 2018-11-22 MED ORDER — ALBUTEROL SULFATE (2.5 MG/3ML) 0.083% IN NEBU
2.5000 mg | INHALATION_SOLUTION | Freq: Once | RESPIRATORY_TRACT | Status: AC
  Administered 2018-11-22: 2.5 mg via RESPIRATORY_TRACT

## 2019-09-06 ENCOUNTER — Telehealth: Payer: Self-pay | Admitting: Internal Medicine

## 2019-09-06 NOTE — Telephone Encounter (Signed)
I'm not going anywhere tomorrow 5/7 so ok to overbook at 1130 if she can come then

## 2019-09-06 NOTE — Telephone Encounter (Signed)
Spoke with Deanna Kirby. She has been added to Dr. Thurston Hole schedule for tomorrow at 1130. Nothing further was needed.

## 2019-09-06 NOTE — Telephone Encounter (Signed)
Spoke with the pt She is a former pt of Dr Juanetta Gosling scheduled to see Dr Sherene Sires in June 2021  She is using o2 in prn and at night but her o2 company [employer] is going out of business and making her turn her o2 in by 10/01/19  She states needing to be seen sooner for o2 order to be sent somewhere else  You have no consult openings open  Do you wish to overbook or use 15 min slot  Vassie Loll and Craige Cotta are booked as well  Please advise, thanks!

## 2019-09-07 ENCOUNTER — Ambulatory Visit: Payer: Medicare Other | Admitting: Internal Medicine

## 2019-09-07 ENCOUNTER — Other Ambulatory Visit (HOSPITAL_COMMUNITY)
Admission: RE | Admit: 2019-09-07 | Discharge: 2019-09-07 | Disposition: A | Discharge status: Home / Self Care | Payer: Medicare Other | Source: Ambulatory Visit | Attending: Internal Medicine | Admitting: Internal Medicine

## 2019-09-07 ENCOUNTER — Ambulatory Visit (HOSPITAL_COMMUNITY)
Admission: RE | Admit: 2019-09-07 | Discharge: 2019-09-07 | Disposition: A | Discharge status: Home / Self Care | Payer: Medicare Other | Source: Ambulatory Visit | Attending: Internal Medicine | Admitting: Internal Medicine

## 2019-09-07 ENCOUNTER — Other Ambulatory Visit: Payer: Self-pay

## 2019-09-07 ENCOUNTER — Encounter: Payer: Self-pay | Admitting: Internal Medicine

## 2019-09-07 DIAGNOSIS — J9611 Chronic respiratory failure with hypoxia: Secondary | ICD-10-CM | POA: Diagnosis not present

## 2019-09-07 DIAGNOSIS — R0609 Other forms of dyspnea: Secondary | ICD-10-CM

## 2019-09-07 DIAGNOSIS — J4530 Mild persistent asthma, uncomplicated: Secondary | ICD-10-CM

## 2019-09-07 DIAGNOSIS — R0600 Dyspnea, unspecified: Secondary | ICD-10-CM | POA: Insufficient documentation

## 2019-09-07 LAB — CBC WITH DIFFERENTIAL/PLATELET
Abs Immature Granulocytes: 0.02 10*3/uL (ref 0.00–0.07)
Basophils Absolute: 0 10*3/uL (ref 0.0–0.1)
Basophils Relative: 0 %
Eosinophils Absolute: 0.1 10*3/uL (ref 0.0–0.5)
Eosinophils Relative: 2 %
HCT: 37.6 % (ref 36.0–46.0)
Hemoglobin: 12.1 g/dL (ref 12.0–15.0)
Immature Granulocytes: 0 %
Lymphocytes Relative: 18 %
Lymphs Abs: 1.2 10*3/uL (ref 0.7–4.0)
MCH: 32.2 pg (ref 26.0–34.0)
MCHC: 32.2 g/dL (ref 30.0–36.0)
MCV: 100 fL (ref 80.0–100.0)
Monocytes Absolute: 0.5 10*3/uL (ref 0.1–1.0)
Monocytes Relative: 6 %
Neutro Abs: 5.2 10*3/uL (ref 1.7–7.7)
Neutrophils Relative %: 74 %
Platelets: 207 10*3/uL (ref 150–400)
RBC: 3.76 MIL/uL — ABNORMAL LOW (ref 3.87–5.11)
RDW: 12.9 % (ref 11.5–15.5)
WBC: 7.1 10*3/uL (ref 4.0–10.5)
nRBC: 0 % (ref 0.0–0.2)

## 2019-09-07 LAB — BASIC METABOLIC PANEL
Anion gap: 10 (ref 5–15)
BUN: 28 mg/dL — ABNORMAL HIGH (ref 8–23)
CO2: 22 mmol/L (ref 22–32)
Calcium: 9.4 mg/dL (ref 8.9–10.3)
Chloride: 108 mmol/L (ref 98–111)
Creatinine, Ser: 0.88 mg/dL (ref 0.44–1.00)
GFR calc Af Amer: >60 mL/min (ref >60)
GFR calc non Af Amer: >60 mL/min (ref >60)
Glucose, Bld: 103 mg/dL — ABNORMAL HIGH (ref 70–99)
Potassium: 3.8 mmol/L (ref 3.5–5.1)
Sodium: 140 mmol/L (ref 135–145)

## 2019-09-07 LAB — TSH: TSH: 2.168 u[IU]/mL (ref 0.350–4.500)

## 2019-09-07 LAB — BRAIN NATRIURETIC PEPTIDE: B Natriuretic Peptide: 43 pg/mL (ref 0.0–100.0)

## 2019-09-07 LAB — D-DIMER, QUANTITATIVE: D-Dimer, Quant: 0.54 ug/mL-FEU — ABNORMAL HIGH (ref 0.00–0.50)

## 2019-09-07 MED ORDER — PANTOPRAZOLE SODIUM 40 MG PO TBEC: 40.0000 mg | DELAYED_RELEASE_TABLET | Freq: Every day | ORAL | 2 refills | Status: AC

## 2019-09-07 MED ORDER — FAMOTIDINE 20 MG PO TABS: ORAL_TABLET | ORAL | 11 refills | Status: AC

## 2019-09-07 MED ORDER — BUDESONIDE-FORMOTEROL FUMARATE 80-4.5 MCG/ACT IN AERO: INHALATION_SPRAY | RESPIRATORY_TRACT | 11 refills | Status: AC

## 2019-09-07 NOTE — Patient Instructions (Addendum)
Plan A = Automatic = Always=    Symbicort 80 Take 2 puffs first thing in am and then another 2 puffs about 12 hours later.   Work on inhaler technique:  relax and gently blow all the way out then take a nice smooth deep breath back in, triggering the inhaler at same time you start breathing in.  Hold for up to 5 seconds if you can. Blow out thru nose. Rinse and gargle with water when done   Plan B = Backup (to supplement plan A, not to replace it) Only use your albuterol inhaler as a rescue medication to be used if you can't catch your breath by resting or doing a relaxed purse lip breathing pattern.  - The less you use it, the better it will work when you need it. - Ok to use the inhaler up to 2 puffs  every 4 hours if you must but call for appointment if use goes up over your usual need - Don't leave home without it !!  (think of it like the spare tire for your car)   Try albuterol 15 min before an activity that you know would make you short of breath and see if it makes any difference and if makes none then don't take it after activity unless you can't catch your breath.      Plan C = Crisis (instead of Plan B but only if Plan B stops working) - only use your albuterol nebulizer if you first try Plan B and it fails to help > ok to use the nebulizer up to every 4 hours but if start needing it regularly call for immediate appointment   Pantoprazole (protonix) 40 mg   Take  30-60 min before first meal of the day and Pepcid (famotidine)  20 mg one after supper  until return to office - this is the best way to tell whether stomach acid is contributing to your problem.     Please remember to go to the lab and x-ray department   for your tests - we will call you with the results when they are available.     Please schedule a follow up office visit in 4 weeks, sooner if needed

## 2019-09-07 NOTE — Progress Notes (Signed)
Deanna Kirby, female    DOB: April 14, 1951, 69 y.o.   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      History of Present Illness  09/07/2019  Pulmonary/ 1st office eval/Lukus Binion on Advair 500  Chief Complaint  Patient presents with  . Consult    former Dr. Juanetta Gosling patient.  Needs to requalify for home o2..  sob with exertion and rest.  dry wheezy cough for the last week.  Dyspnea:  Walks floors at appt building up to 15 min slow pace s 02  Cough: a lot of cough in pm / worse with voice assoc with pnds  Sleep: 3 pillows with flat cough worse initially some better  SABA use: has it not using  02 2lpm hs and prn  No better with prednisone.  No obvious day to day or daytime variability or assoc excess/ purulent sputum or mucus plugs or hemoptysis or cp or chest tightness, subjective wheeze or overt   hb symptoms.    . Also denies any obvious fluctuation of symptoms with weather or environmental changes or other aggravating or alleviating factors except as outlined above   No unusual exposure hx or h/o childhood pna/ asthma or knowledge of premature birth.  Current Allergies, Complete Past Medical History, Past Surgical History, Family History, and Social History were reviewed in [employer] record.  ROS  The following are not active complaints unless bolded Hoarseness, sore throat, dysphagia, dental problems, itching, sneezing,  nasal congestion or discharge of excess mucus or purulent secretions, ear ache,   fever, chills, sweats, unintended wt loss or wt gain, classically pleuritic or exertional  cp,  orthopnea pnd or arm/hand swelling  or leg swelling, presyncope, palpitations, abdominal pain, anorexia, nausea, vomiting, diarrhea  or change in bowel habits or change in bladder habits, change in stools or change in urine, dysuria, hematuria,  rash, arthralgias, visual complaints, headache, numbness, weakness or ataxia or problems with walking or coordination,  change in mood or  memory.            Past Medical History:  Diagnosis Date  . Asthma   . COPD (chronic obstructive pulmonary disease) (HCC)    Reportedly moderate  . Depression   . GERD (gastroesophageal reflux disease)   . History of seizures   . Mitral regurgitation   . Mixed hyperlipidemia   . Restless leg syndrome   . Stevens-Johnson syndrome [facility])     Outpatient Medications Prior to Visit  Medication Sig Dispense Refill  . Albuterol Sulfate (PROAIR HFA IN) Inhale into the lungs as directed.    Marland Kitchen ALPRAZolam (XANAX) 0.5 MG tablet Take 0.125 mg by mouth at bedtime as needed for sleep.     Marland Kitchen aspirin 81 MG tablet Take 81 mg by mouth daily.    . budesonide (PULMICORT) 1 MG/2ML nebulizer solution Take 0.5 mg by nebulization every 4 (four) hours as needed.    Marland Kitchen escitalopram (LEXAPRO) 20 MG tablet Take 20 mg by mouth daily.    . Fluticasone-Salmeterol (ADVAIR) 500-50 MCG/DOSE AEPB Inhale 1 puff into the lungs 2 (two) times  daily.    . furosemide (LASIX) 20 MG tablet 1 to 2 tabs po qd    . gabapentin (NEURONTIN) 300 MG capsule Take 300 mg by mouth 2 (two) times daily.    Marland Kitchen ipratropium-albuterol (DUONEB) 0.5-2.5 (3) MG/3ML SOLN Take 3 mLs by nebulization as needed.    . pramipexole (MIRAPEX) 0.5 MG tablet Take 0.5 mg by mouth. 2  tab po qhs    . simvastatin (ZOCOR) 40 MG tablet Take 40 mg by mouth every evening.    . topiramate (TOPAMAX) 100 MG tablet Take 100 mg by mouth 2 (two) times daily.    Marland Kitchen atenolol (TENORMIN) 25 MG tablet Take 25 mg by mouth daily.    Marland Kitchen ibuprofen (ADVIL,MOTRIN) 800 MG tablet Take 800 mg by mouth 3  (three) times daily.    .       .       . QUEtiapine (SEROQUEL) 50 MG tablet Take 50 mg by mouth 2 (two) times daily.    . sertraline (ZOLOFT) 100 MG tablet 1  1/2 tab po qd        Objective:     BP 140/80 (BP Location: Right Arm, Cuff Size: Large)   Pulse 74   Temp (!) 97 F (36.1 C) (Temporal)   Ht 5\' 2"  (1.575 m)   Wt 234 lb (106.1 kg)   SpO2 100%   BMI 42.80 kg/m   SpO2: 100 %   amb obese wf with voice fatigue walks with rollator    HEENT : pt wearing mask not removed for exam due to covid -19 concerns.    NECK :  without JVD/Nodes/TM/ nl carotid upstrokes bilaterally   LUNGS: no acc muscle use,  Nl contour chest which is clear to A and P bilaterally without cough on insp or exp maneuvers   CV:  RRR  no s3 or murmur or increase in P2, and no edema   ABD:  Obese soft and nontender with nl inspiratory excursion in the supine position. No bruits or organomegaly appreciated, bowel sounds nl  MS:  Nl gait/ ext warm without deformities, calf tenderness, cyanosis or clubbing No obvious joint restrictions   SKIN: warm and dry without lesions    NEURO:  alert, approp, nl sensorium with  no motor or cerebellar deficits apparent.    CXR PA and Lateral:   09/07/2019 :    I personally reviewed images and   impression as follows:   Small lung volumes, cm but improved vs prior/ no acute parenchymal  changes  Radiology over read: no active dz    Labs ordered/ reviewed:      Chemistry      Component Value Date/Time   NA 140 09/07/2019 1312   K 3.8 09/07/2019 1312   CL 108 09/07/2019 1312   CO2 22 09/07/2019 1312   BUN 28 (H) 09/07/2019 1312   CREATININE 0.88 09/07/2019 1312   CREATININE 0.81 09/04/2012 1424      Component Value Date/Time   CALCIUM 9.4 09/07/2019 1312        Lab Results  Component Value Date   WBC 7.1 09/07/2019   HGB 12.1 09/07/2019   HCT 37.6 09/07/2019   MCV 100.0 09/07/2019   PLT 207 09/07/2019       EOS  0.1                                    09/07/2019   Lab Results  Component Value Date   DDIMER 0.54 (H) 09/07/2019      Lab Results  Component Value Date   TSH 2.168 09/07/2019       BNP  09/07/2019  = 43      Labs ordered 09/07/2019  :    alpha one AT phenotype       Assessment   No problem-specific Assessment & Plan notes found for this encounter.     Christinia Gully, MD 09/07/2019

## 2019-09-07 NOTE — Progress Notes (Signed)
Pt. Returned call.  I provided test results per Dr. Sherene Sires. Pt. Stated that Dr. Sherene Sires wanted her to resume her Attenolol, however, her prescription is expired and she wanted to know if Dr. Sherene Sires would refill it for her.

## 2019-09-07 NOTE — Telephone Encounter (Signed)
Received email from patient :  Dr. Sherene Sires; I was looking at my updated medication list and I see you would like me to take my atenolol 25 mg po. What I have on hand has expired. It was d/c'd in December 2018 due to hypotension.   Would you mind calling it in to [facility] in [place] for me ? The pharmacy info is in my chart.  It was a pleasure meeting you today. Thank you for wanting to get to the bottom of what's going with me.    Dr. Sherene Sires please advise

## 2019-09-07 NOTE — Progress Notes (Signed)
Attempted to reach pt at her home #, no answer, then reached a message that my call could not be completed as dialed.

## 2019-09-07 NOTE — Assessment & Plan Note (Addendum)
Never smoker - CPST 11/01/12  c/w fatigue nl ventilation/ cardiac reserve  - PFT's  11/22/2018  FEV1 1.80 (81 % ) ratio 0.79  p 8 % improvement from saba  And ERV 22%  -  09/07/2019   Walked  approx 481ft h @ slow  pace  stopped due to sob with sats still 95%   Symptoms are   disproportionate to objective findings and not clear to what extent this is actually a pulmonary  problem but pt does appear to have difficult to sort out respiratory symptoms of unknown origin for which  DDX  = almost all start with A and  include Adherence, Ace Inhibitors, Acid Reflux, Active Sinus Disease, Alpha 1 Antitripsin deficiency, Anxiety masquerading as Airways dz,  ABPA,  Allergy(esp in young), Aspiration (esp in elderly), Adverse effects of meds,  Active smoking or Vaping, A bunch of PE's/clot burden (a few small clots can't cause this syndrome unless there is already severe underlying pulm or vascular dz with poor reserve),  Anemia or thyroid disorder, plus two Bs  = Bronchiectasis and Beta blocker use..and one C= CHF    Adherence is always the initial "prime suspect" and is a multilayered concern that requires a "trust but verify" approach in every patient - starting with knowing how to use medications, especially inhalers, correctly, keeping up with refills and understanding the fundamental difference between maintenance and prns vs those medications only taken for a very short course and then stopped and not refilled.   ?Adverse effects of Advair > rec change to symbicort (see asthma)   ? Acid (or non-acid) GERD > always difficult to exclude as up to 75% of pts in some series report no assoc GI/ Heartburn symptoms> rec max (24h)  acid suppression and diet restrictions/ reviewed and instructions given in writing.   ? Anxiety/depression/ deconditioning  > usually at the bottom of this list of usual suspects a nd note already on psychotropics and may interfere with adherence and also interpretation of response or lack  thereof to symptom management which can be quite subjective.   ? Beta blocker effects prob ok on low dose tenormin but In the setting of respiratory symptoms of unknown etiology,  It would be preferable to use bystolic, the most beta -1  selective Beta blocker available in sample form, with bisoprolol the most selective generic choice  on the market, at least on a trial basis, to make sure the spillover Beta 2 effects of the less specific Beta blockers are not contributing to this patient's symptoms.   ? A bunch of PE's / at risk due to obesity > D dimer nl - while a normal  or high normal value (seen commonly in the elderly or chronically ill)  may miss small peripheral pe, the clot burden with sob is moderately high and the d dimer  has a very high neg pred value if used in this setting.    ? chf > excluded on basis of very low bnp

## 2019-09-08 ENCOUNTER — Encounter: Payer: Self-pay | Admitting: Internal Medicine

## 2019-09-08 DIAGNOSIS — J9611 Chronic respiratory failure with hypoxia: Secondary | ICD-10-CM | POA: Insufficient documentation

## 2019-09-08 NOTE — Assessment & Plan Note (Signed)
Never smoker - PFT's  11/22/2018  FEV1 1.80 (81 % ) ratio 0.79  p 8 % improvement from saba  And ERV 22% ? Was she on maint copd meds then ? - 09/07/2019  After extensive coaching inhaler device,  effectiveness =    75% > changed advair to symb 80 2bid   advair 500 may be bothering her upper airway more than helping her lower so will try symb 80 2bid  I spent extra time with pt today reviewing appropriate use of albuterol for prn use on exertion with the following points: 1) saba is for relief of sob that does not improve by walking a slower pace or resting but rather if the pt does not improve after trying this first. 2) If the pt is convinced, as many are, that saba helps recover from activity faster then it's easy to tell if this is the case by re-challenging : ie stop, take the inhaler, then p 5 minutes try the exact same activity (intensity of workload) that just caused the symptoms and see if they are substantially diminished or not after saba 3) if there is an activity that reproducibly causes the symptoms, try the saba 15 min before the activity on alternate days   If in fact the saba really does help, then fine to continue to use it prn but advised may need to look closer at the maintenance regimen being used to achieve better control of airways disease with exertion.

## 2019-09-08 NOTE — Assessment & Plan Note (Signed)
Body mass index is 42.8 kg/m   Lab Results  Component Value Date   TSH 2.168 09/07/2019     Contributing to gerd risk/ doe/reviewed the need and the process to achieve and maintain neg calorie balance > defer f/u primary care including intermittently monitoring thyroid status.

## 2019-09-08 NOTE — Assessment & Plan Note (Signed)
Did not qualify for amb 02  09/07/2019    Main issue is conditioning but rec Make sure you check your oxygen saturations at highest level of activity to be sure it stays over 90%           Each maintenance medication was reviewed in detail including emphasizing most importantly the difference between maintenance and prns and under what circumstances the prns are to be triggered using an action plan format where appropriate.  Total time for H and P, chart review, counseling, teaching device/ directly observing portions of ambulatory 02 saturation study/  and generating customized AVS unique to this office visit / charting = 60 min

## 2019-09-10 NOTE — Progress Notes (Signed)
Called patient to make pt aware of Dr. Thurston Hole response to prescribing the the Atenolol.  Provided information to pt via her vm that was in message from Dr. Sherene Sires.  Advised her to call the [place] office for any further questions/concerns.

## 2019-09-11 NOTE — Progress Notes (Signed)
Called and left a detailed msg on machine ok per DPR

## 2019-09-12 LAB — ALPHA-1 ANTITRYPSIN PHENOTYPE: A-1 Antitrypsin, Ser: 89 mg/dL — ABNORMAL LOW (ref 101–187)

## 2019-09-13 NOTE — Progress Notes (Signed)
LMTCB

## 2019-09-18 NOTE — Progress Notes (Signed)
Spoke with the pt and notified of results. She is seeing another pulmonologist, Dr Orson Aloe in [place] and I will fax him copy of these results as the pt does not wish to f/u here.

## 2019-10-03 ENCOUNTER — Institutional Professional Consult (permissible substitution): Payer: Medicare HMO | Admitting: Internal Medicine

## 2019-10-04 ENCOUNTER — Ambulatory Visit: Payer: Medicare Other | Admitting: Internal Medicine

## 2021-08-07 IMAGING — DX DG CHEST 2V
2 series · 2 of 2 positions shown · non-contrast
Comparison: 04/20/2017 chest radiograph.

CLINICAL DATA: Dyspnea on exertion

EXAM:
CHEST - 2 VIEW

[chest pa]
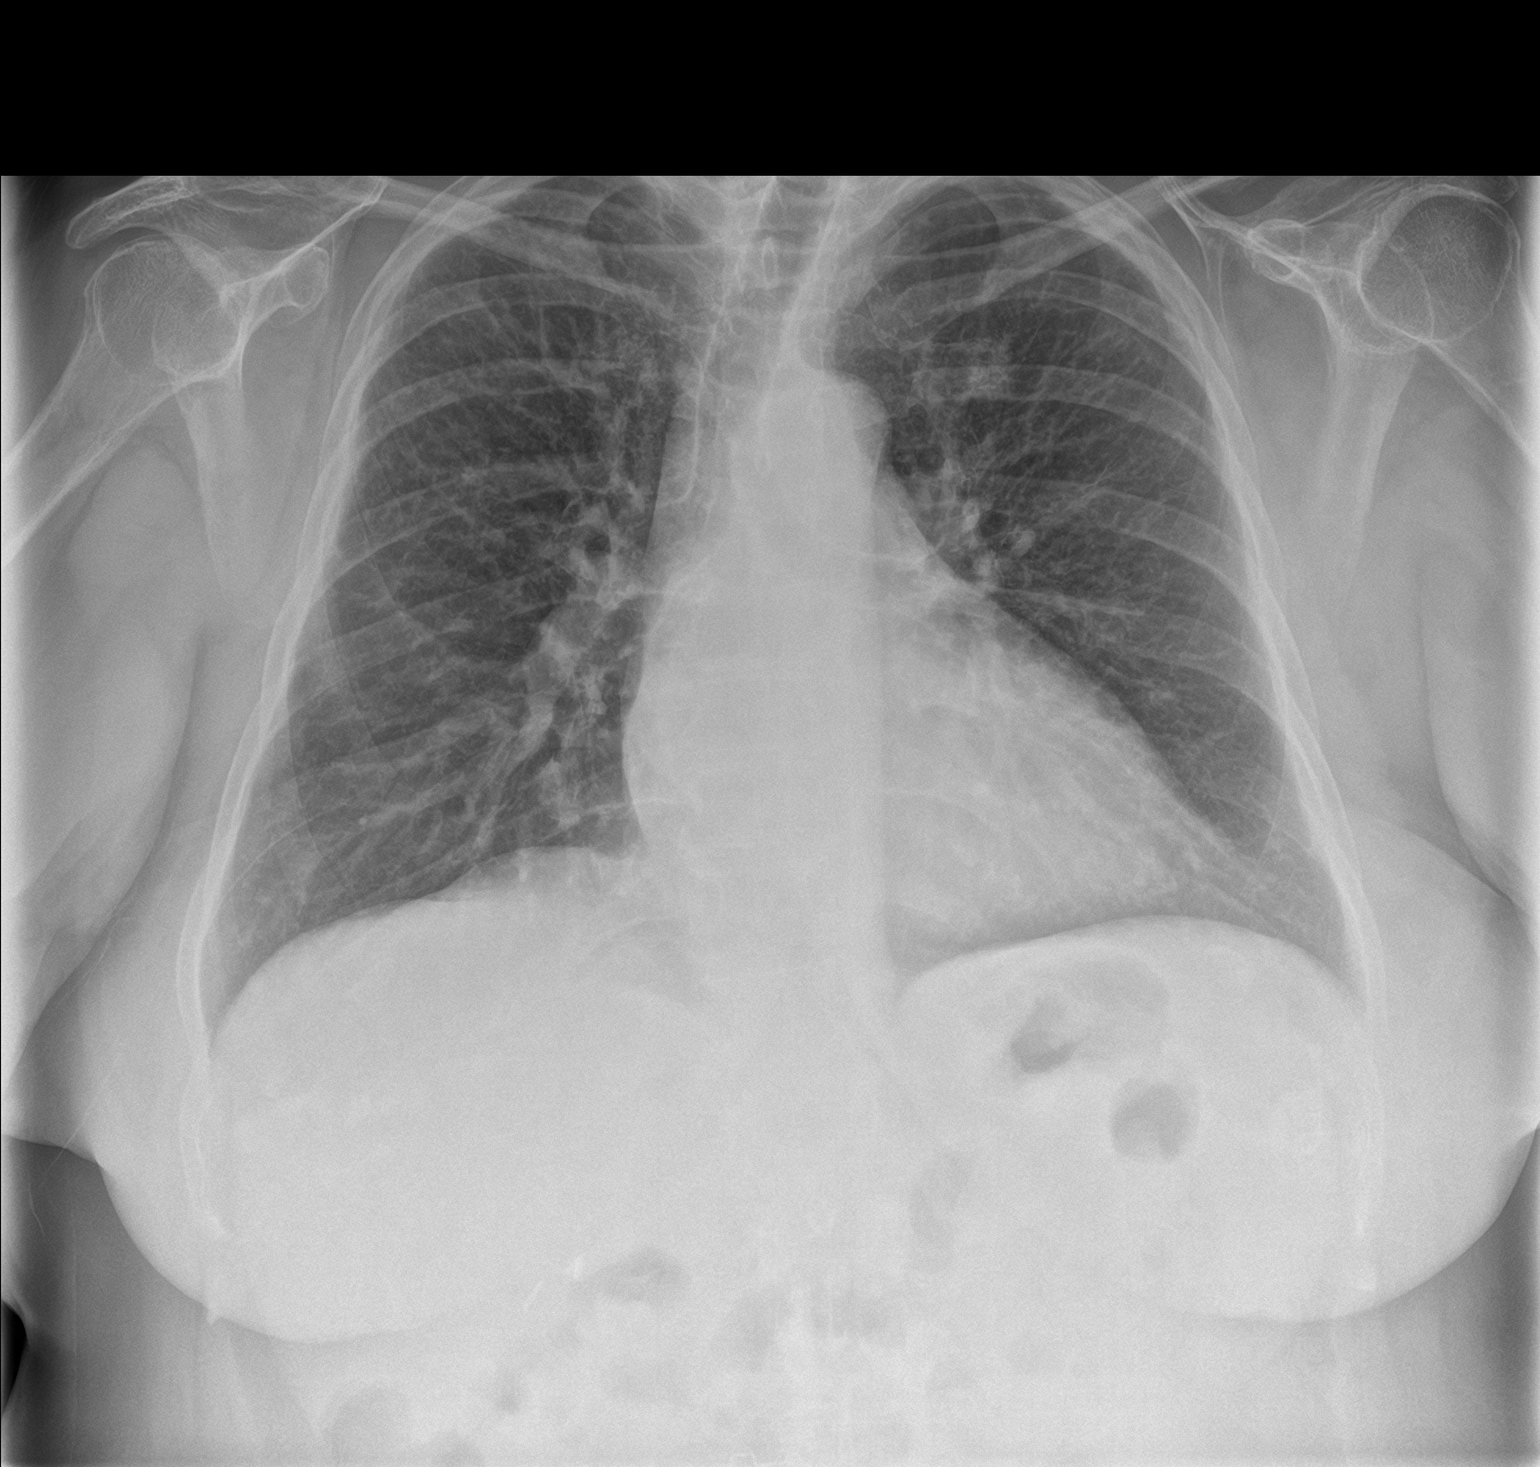

[chest lat]
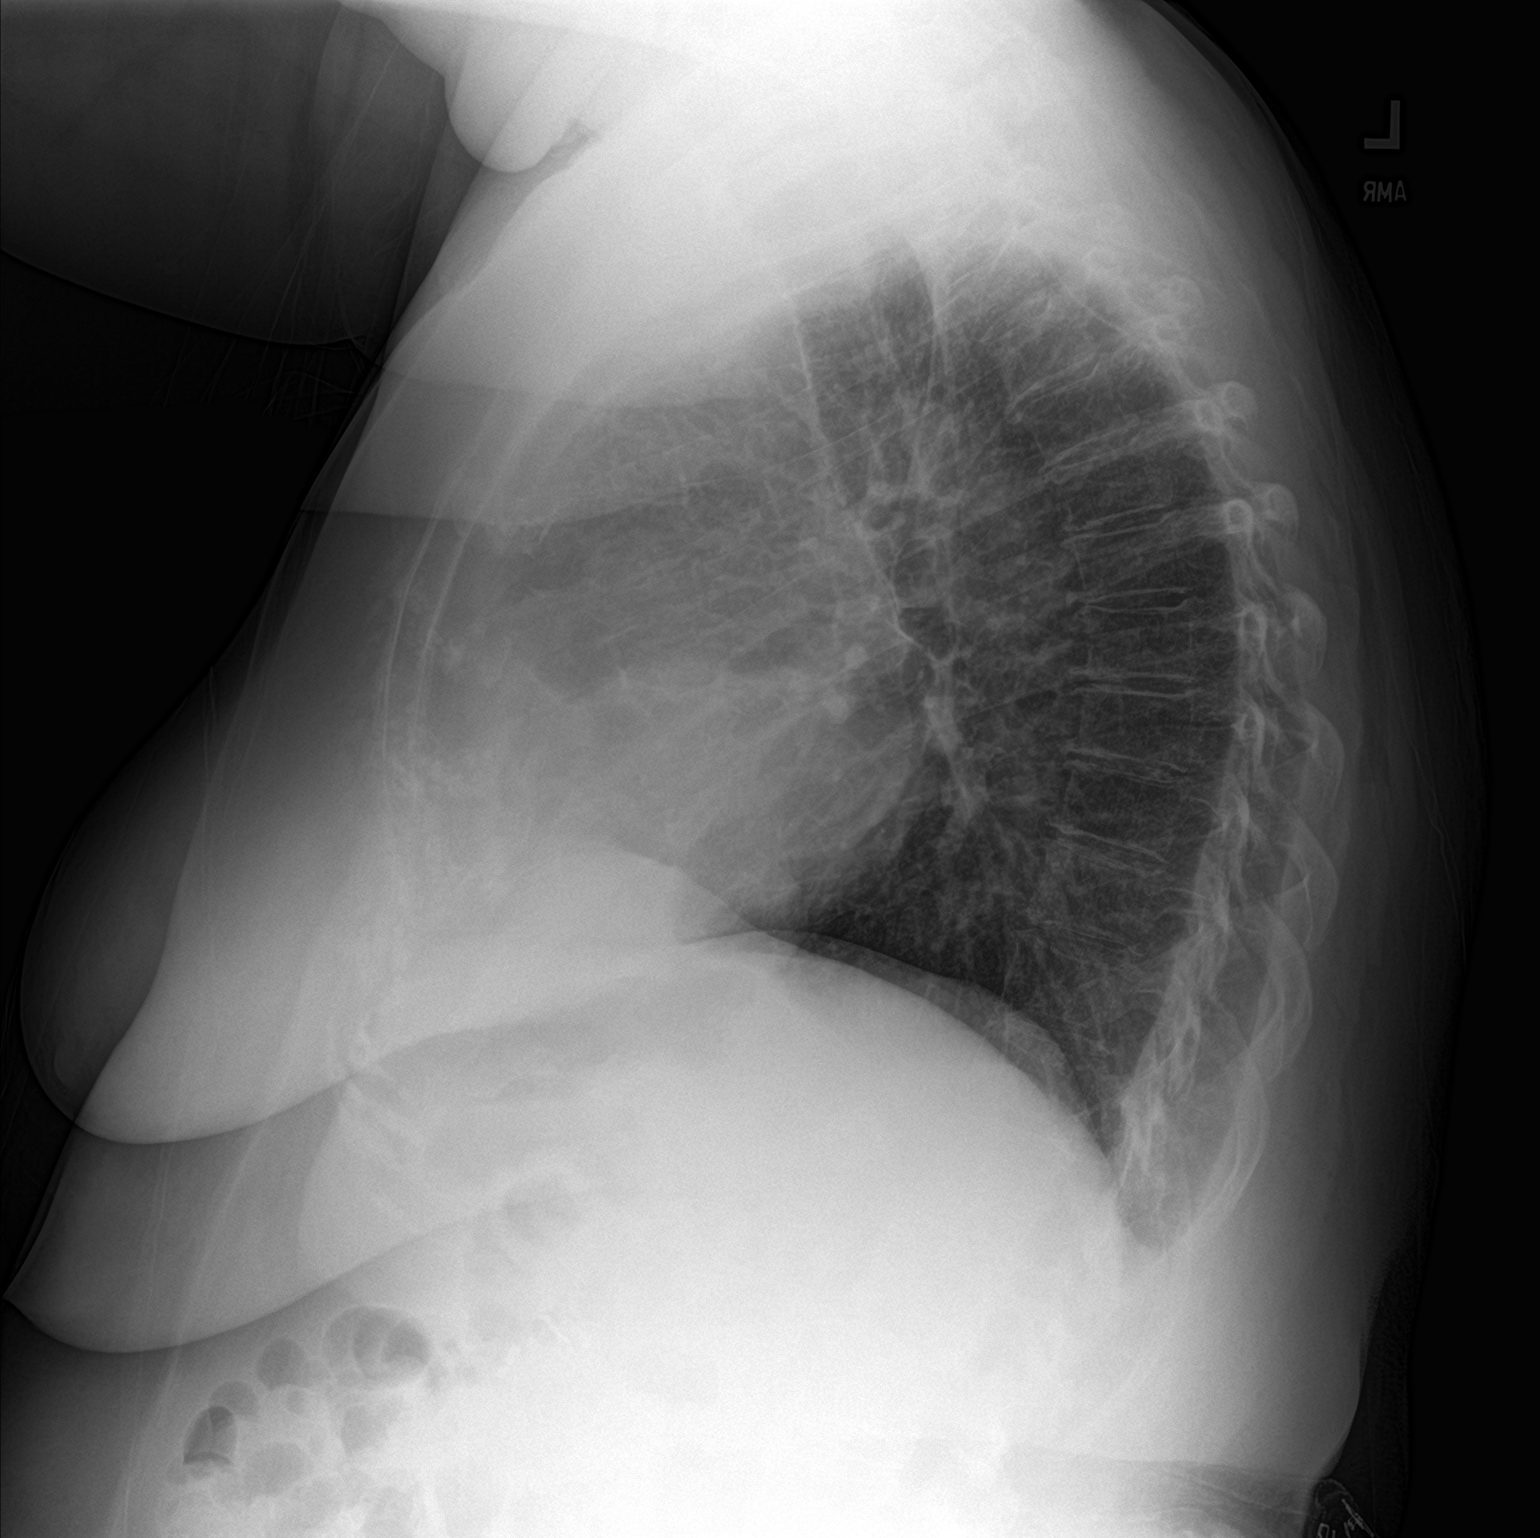

[2 of 2 positions shown; findings below may reference images not displayed]

FINDINGS: Stable cardiomediastinal silhouette with top-normal heart size. No
pneumothorax. No pleural effusion. Lungs appear clear, with no acute
consolidative airspace disease and no pulmonary edema.
IMPRESSION: No active cardiopulmonary disease.
